# Patient Record
Sex: Female | Born: 1955 | Race: White | Hispanic: No | Marital: Single | Smoking: Current every day smoker
Health system: Southern US, Community
[De-identification: ages and names within clinical notes are randomized; demographics above are authoritative.]

## PROBLEM LIST (undated history)

## (undated) DIAGNOSIS — I10 Essential (primary) hypertension: Secondary | ICD-10-CM

## (undated) DIAGNOSIS — M109 Gout, unspecified: Secondary | ICD-10-CM

---

## 1997-10-16 ENCOUNTER — Other Ambulatory Visit: Admission: RE | Admit: 1997-10-16 | Discharge: 1997-10-16 | Payer: Self-pay | Admitting: Obstetrics and Gynecology

## 1999-01-12 ENCOUNTER — Other Ambulatory Visit: Admission: RE | Admit: 1999-01-12 | Discharge: 1999-01-12 | Payer: Self-pay | Admitting: Obstetrics and Gynecology

## 2000-05-02 ENCOUNTER — Other Ambulatory Visit: Admission: RE | Admit: 2000-05-02 | Discharge: 2000-05-02 | Payer: Self-pay | Admitting: Obstetrics and Gynecology

## 2001-11-13 ENCOUNTER — Other Ambulatory Visit: Admission: RE | Admit: 2001-11-13 | Discharge: 2001-11-13 | Payer: Self-pay | Admitting: Obstetrics and Gynecology

## 2003-05-14 ENCOUNTER — Other Ambulatory Visit: Admission: RE | Admit: 2003-05-14 | Discharge: 2003-05-14 | Payer: Self-pay | Admitting: Obstetrics and Gynecology

## 2005-01-03 ENCOUNTER — Other Ambulatory Visit: Admission: RE | Admit: 2005-01-03 | Discharge: 2005-01-03 | Payer: Self-pay | Admitting: Obstetrics and Gynecology

## 2008-07-26 ENCOUNTER — Emergency Department (HOSPITAL_COMMUNITY): Admission: EM | Admit: 2008-07-26 | Discharge: 2008-07-26 | Payer: Self-pay | Admitting: Emergency Medicine

## 2011-09-13 ENCOUNTER — Encounter (HOSPITAL_COMMUNITY): Payer: Self-pay | Admitting: Emergency Medicine

## 2011-09-13 ENCOUNTER — Emergency Department (HOSPITAL_COMMUNITY)
Admission: EM | Admit: 2011-09-13 | Discharge: 2011-09-14 | Disposition: A | Discharge status: Home / Self Care | Payer: Self-pay | Attending: Emergency Medicine | Admitting: Emergency Medicine

## 2011-09-13 DIAGNOSIS — I10 Essential (primary) hypertension: Secondary | ICD-10-CM | POA: Insufficient documentation

## 2011-09-13 DIAGNOSIS — W108XXA Fall (on) (from) other stairs and steps, initial encounter: Secondary | ICD-10-CM | POA: Insufficient documentation

## 2011-09-13 DIAGNOSIS — H5500 Unspecified nystagmus: Secondary | ICD-10-CM | POA: Insufficient documentation

## 2011-09-13 DIAGNOSIS — T148XXA Other injury of unspecified body region, initial encounter: Secondary | ICD-10-CM

## 2011-09-13 DIAGNOSIS — IMO0002 Reserved for concepts with insufficient information to code with codable children: Secondary | ICD-10-CM | POA: Insufficient documentation

## 2011-09-13 HISTORY — DX: Essential (primary) hypertension: I10

## 2011-09-13 NOTE — ED Notes (Signed)
Pt BIB EMS. Pt was walking home from work and fell and hit her head. Pt admits to having a few drinks before walking home. Pt denies LOC, nausea and vomiting. Pt has small lac on R side of head. Bleeding controlled.

## 2011-09-13 NOTE — ED Notes (Signed)
ZOX:[id number]<AV> Expected date:09/13/11<BR> Expected time:11:15 PM<BR> Means of arrival:Ambulance<BR> Comments:<BR> Fall, head injury, etoh

## 2011-09-14 ENCOUNTER — Emergency Department (HOSPITAL_COMMUNITY): Payer: Self-pay

## 2011-09-14 MED ORDER — HYDROCODONE-ACETAMINOPHEN 5-325 MG PO TABS: 1.0000 | ORAL_TABLET | Freq: Four times a day (QID) | ORAL | Status: AC | PRN

## 2011-09-14 MED ORDER — LIDOCAINE VISCOUS 2 % MT SOLN
20.0000 mL | Freq: Once | OROMUCOSAL | Status: AC
  Administered 2011-09-14: 20 mL via OROMUCOSAL
  Filled 2011-09-14: qty 20

## 2011-09-14 MED ORDER — TETANUS-DIPHTH-ACELL PERTUSSIS 5-2.5-18.5 LF-MCG/0.5 IM SUSP
0.5000 mL | Freq: Once | INTRAMUSCULAR | Status: AC
  Administered 2011-09-14: 0.5 mL via INTRAMUSCULAR
  Filled 2011-09-14: qty 0.5

## 2011-09-14 NOTE — ED Provider Notes (Signed)
Medical screening examination/treatment/procedure(s) were performed by non-physician practitioner and as supervising physician I was immediately available for consultation/collaboration.  Mischele Detter, MD 09/14/11 0805 

## 2011-09-14 NOTE — ED Notes (Signed)
Patient transported to CT 

## 2011-09-14 NOTE — ED Provider Notes (Signed)
History     CSN: [id number]  Arrival date & time 09/13/11  2326   First MD Initiated Contact with Patient 09/13/11 2338      Chief Complaint  Patient presents with  . Fall  . Head Laceration    (Consider location/radiation/quality/duration/timing/severity/associated sxs/prior treatment) HPI Comments: Patient with a history of hypertension presents emergency department with a chief complaint of fall and head laceration.  Patient states that she had had a couple of drinks before walking home when she fell going up the stairs.  Patient's head hit the concrete causing a laceration.  Bleeding has been controlled.  Patient denies any change in vision, nausea, disequilibrium, headaches, lightheadedness, syncope, loss of consciousness or other pain medications.  Patient denies being on blood thinners but reports taking aspirin daily. TDap status unknown.   Patient is a 56 y.o. female presenting with fall and scalp laceration. The history is provided by the patient.  Fall Pertinent negatives include no fever, no numbness, no abdominal pain and no headaches.  Head Laceration Pertinent negatives include no abdominal pain, chest pain, chills, congestion, fever, headaches, numbness or weakness.    Past Medical History  Diagnosis Date  . Hypertension     No past surgical history on file.  No family history on file.  History  Substance Use Topics  . Smoking status: Not on file  . Smokeless tobacco: Not on file  . Alcohol Use:     OB History    Grav Para Term Preterm Abortions TAB SAB Ect Mult Living                  Review of Systems  Constitutional: Negative for fever, chills and appetite change.  HENT: Negative for congestion.   Eyes: Negative for visual disturbance.  Respiratory: Negative for shortness of breath.   Cardiovascular: Negative for chest pain and leg swelling.  Gastrointestinal: Negative for abdominal pain.  Genitourinary: Negative for dysuria, urgency and  frequency.  Skin: Positive for wound.  Neurological: Negative for dizziness, syncope, weakness, light-headedness, numbness and headaches.  Psychiatric/Behavioral: Negative for confusion.    Allergies  Review of patient's allergies indicates no known allergies.  Home Medications   Current Outpatient Rx  Name Route Sig Dispense Refill  . BC FAST PAIN RELIEF ARTHRITIS PO Oral Take 1 packet by mouth daily as needed. For arthritis pain      BP 163/103  Pulse 91  Temp(Src) 97.7 F (36.5 C) (Oral)  Resp 18  SpO2 99%  Physical Exam  Nursing note and vitals reviewed. Constitutional: She is oriented to person, place, and time. She appears well-developed and well-nourished. No distress.  HENT:  Head: Normocephalic. Head is with abrasion. Head is without laceration.    Nose: Nose normal.       3 cm superficial abrasion located on the right scalp.  Bleeding controlled.  No raccoons eyes or battle sign.  No tenderness to palpation of  superior or inferior orbits.  Eyes: Conjunctivae and EOM are normal.  Neck: Normal range of motion.  Pulmonary/Chest: Effort normal.  Musculoskeletal: Normal range of motion.  Neurological: She is alert and oriented to person, place, and time. GCS eye subscore is 4. GCS verbal subscore is 5. GCS motor subscore is 6.       Cranial nerves III through XII intact. Bilateral nystagmus present. Gait and coordination normal.  Strength 5/5 bilaterally in upper and lower extremities.  Sensation intact.  Skin: Skin is warm and dry. No rash noted. She  is not diaphoretic.  Psychiatric: She has a normal mood and affect. Her behavior is normal.    ED Course  Procedures (including critical care time)  Labs Reviewed - No data to display Ct Head Wo Contrast  09/14/2011  *RADIOLOGY REPORT*  Clinical Data: Laceration status post fall.  CT HEAD WITHOUT CONTRAST  Technique:  Contiguous axial images were obtained from the base of the skull through the vertex without  contrast.  Comparison: None.  Findings: Mild prominence of the sulci, cisterns, and ventricles, in keeping with volume loss. There are mild subcortical and periventricular white matter hypodensities, a nonspecific finding most often seen with chronic microangiopathic changes.  There is no evidence for acute hemorrhage, overt hydrocephalus, mass lesion, or abnormal extra-axial fluid collection.  No definite CT evidence for acute cortical based (large artery) infarction. Bilateral basal ganglia hypodensities may reflect prominent perivascular spaces or remote lacunar infarctions. The visualized paranasal sinuses and mastoid air cells are predominately clear. No displaced calvarial fracture.  There is a right posterior scalp hematoma and laceration without underlying calvarial fracture.  IMPRESSION: No acute intracranial abnormality. Bilateral basal ganglia hypodensities may reflect prominent perivascular spaces or remote lacunar infarctions.  Right posterior scalp laceration/hematoma.  No underlying calvarial fracture identified  Original Report Authenticated By: Waneta Martins, M.D.     No diagnosis found.    MDM  Scalp abrasion/laceration Hypertension  Pt pain free in ED, bleeding controlled and Tdap given  Non concerning for SAH, ICH, Meningitis, or fracture. Pt is afebrile with no focal neuro deficits, nuchal rigidity, or change in vision. Pt is to follow up with PCP to discuss prophylactic medication. Pt verbalizes understanding and is agreeable with plan to dc.          Jaci Carrel, [place] 09/14/11 [phone]

## 2011-09-14 NOTE — ED Notes (Signed)
Pt states she had "6 beers and a couple of shots" and had something heavy in her hands and lost her balance and hit her head on the concrete. Pt denies LOC. Pt a/o x3. Pt denies nausea and vomiting.

## 2011-09-14 NOTE — Discharge Instructions (Signed)
Abrasions Abrasions are skin scrapes. Their treatment depends on how large and deep the abrasion is. Abrasions do not extend through all layers of the skin. A cut or lesion through all skin layers is called a laceration. HOME CARE INSTRUCTIONS   If you were given a dressing, change it at least once a day or as instructed by your caregiver. If the bandage sticks, soak it off with a solution of water or hydrogen peroxide.   Twice a day, wash the area with soap and water to remove all the cream/ointment. You may do this in a sink, under a tub faucet, or in a shower. Rinse off the soap and pat dry with a clean towel. Look for signs of infection (see below).   Reapply cream/ointment according to your caregiver's instruction. This will help prevent infection and keep the bandage from sticking. Telfa or gauze over the wound and under the dressing or wrap will also help keep the bandage from sticking.   If the bandage becomes wet, dirty, or develops a foul smell, change it as soon as possible.   Only take over-the-counter or prescription medicines for pain, discomfort, or fever as directed by your caregiver.  SEEK IMMEDIATE MEDICAL CARE IF:   Increasing pain in the wound.   Signs of infection develop: redness, swelling, surrounding area is tender to touch, or pus coming from the wound.   You have a fever.   Any foul smell coming from the wound or dressing.  Most skin wounds heal within ten days. Facial wounds heal faster. However, an infection may occur despite proper treatment. You should have the wound checked for signs of infection within 24 to 48 hours or sooner if problems arise. If you were not given a wound-check appointment, look closely at the wound yourself on the second day for early signs of infection listed above. MAKE SURE YOU:   Understand these instructions.   Will watch your condition.   Will get help right away if you are not doing well or get worse.  Document Released:  01/18/2005 Document Revised: 03/30/2011 Document Reviewed: 03/14/2011 Exit[employer] Patient Information 2012 [employer]. 

## 2011-09-14 NOTE — ED Notes (Signed)
Remigio Eisenmenger, pt's ex-husband,  phone number [phone].

## 2012-10-04 ENCOUNTER — Encounter (HOSPITAL_COMMUNITY): Payer: Self-pay | Admitting: Emergency Medicine

## 2012-10-04 ENCOUNTER — Emergency Department (HOSPITAL_COMMUNITY)
Admission: EM | Admit: 2012-10-04 | Discharge: 2012-10-04 | Disposition: A | Discharge status: Home / Self Care | Payer: Self-pay | Attending: Emergency Medicine | Admitting: Emergency Medicine

## 2012-10-04 ENCOUNTER — Ambulatory Visit: Payer: Self-pay

## 2012-10-04 ENCOUNTER — Emergency Department (HOSPITAL_COMMUNITY): Payer: Self-pay

## 2012-10-04 DIAGNOSIS — S46909A Unspecified injury of unspecified muscle, fascia and tendon at shoulder and upper arm level, unspecified arm, initial encounter: Secondary | ICD-10-CM | POA: Insufficient documentation

## 2012-10-04 DIAGNOSIS — Y9389 Activity, other specified: Secondary | ICD-10-CM | POA: Insufficient documentation

## 2012-10-04 DIAGNOSIS — S52209A Unspecified fracture of shaft of unspecified ulna, initial encounter for closed fracture: Secondary | ICD-10-CM | POA: Insufficient documentation

## 2012-10-04 DIAGNOSIS — I10 Essential (primary) hypertension: Secondary | ICD-10-CM | POA: Insufficient documentation

## 2012-10-04 DIAGNOSIS — F17200 Nicotine dependence, unspecified, uncomplicated: Secondary | ICD-10-CM | POA: Insufficient documentation

## 2012-10-04 DIAGNOSIS — S5290XA Unspecified fracture of unspecified forearm, initial encounter for closed fracture: Secondary | ICD-10-CM | POA: Insufficient documentation

## 2012-10-04 DIAGNOSIS — Y9289 Other specified places as the place of occurrence of the external cause: Secondary | ICD-10-CM | POA: Insufficient documentation

## 2012-10-04 DIAGNOSIS — Z791 Long term (current) use of non-steroidal anti-inflammatories (NSAID): Secondary | ICD-10-CM | POA: Insufficient documentation

## 2012-10-04 DIAGNOSIS — S4980XA Other specified injuries of shoulder and upper arm, unspecified arm, initial encounter: Secondary | ICD-10-CM | POA: Insufficient documentation

## 2012-10-04 DIAGNOSIS — S52611A Displaced fracture of right ulna styloid process, initial encounter for closed fracture: Secondary | ICD-10-CM

## 2012-10-04 DIAGNOSIS — R209 Unspecified disturbances of skin sensation: Secondary | ICD-10-CM | POA: Insufficient documentation

## 2012-10-04 DIAGNOSIS — S5291XA Unspecified fracture of right forearm, initial encounter for closed fracture: Secondary | ICD-10-CM

## 2012-10-04 DIAGNOSIS — R296 Repeated falls: Secondary | ICD-10-CM | POA: Insufficient documentation

## 2012-10-04 NOTE — ED Provider Notes (Signed)
History    This chart was scribed for Paula Chandler, non-physician practitioner working with Vida Roller, MD by Leone Payor, ED Scribe. This patient was seen in room WTR7/WTR7 and the patient's care was started at 1536.   CSN: [id number]  Arrival date & time 10/04/12  1536   First MD Initiated Contact with Patient 10/04/12 1600      Chief Complaint  Patient presents with  . Fall  . Arm Injury  . Hand Injury    hand swollen due to fall     The history is provided by the patient. No language interpreter was used.    HPI Comments: Paula Chandler is a 57 y.o. female who presents to the Emergency Department complaining of a R hand and forearm injury that occurred 3 days ago (10/02/12) after a fall. She hit her forearm on the middle of a step. States the pain is bearable and is not excruciating. She did not hit her head or have LOC. The pain was not very bad at first and she only noticed the swelling upon waking yesterday. She has noticed the redness and warmth today. She has taken aleve, used ice, and has been elevating the injured arm. She denies numbness, elbow pain, chest pain, SOB.     Past Medical History  Diagnosis Date  . Hypertension     History reviewed. No pertinent past surgical history.  Family History  Problem Relation Age of Onset  . Cancer Mother     History  Substance Use Topics  . Smoking status: Current Every Day Smoker    Types: Cigarettes  . Smokeless tobacco: Not on file  . Alcohol Use: Yes    OB History   Grav Para Term Preterm Abortions TAB SAB Ect Mult Living                  Review of Systems  Respiratory: Negative for shortness of breath.   Cardiovascular: Negative for chest pain.  Musculoskeletal: Positive for joint swelling and arthralgias.  Neurological: Negative for numbness.       Tingling   All other systems reviewed and are negative.    Allergies  Review of patient's allergies indicates no known allergies.  Home  Medications   Current Outpatient Rx  Name  Route  Sig  Dispense  Refill  . Aspirin-Caffeine (BC FAST PAIN RELIEF ARTHRITIS PO)   Oral   Take 1 packet by mouth daily as needed. For arthritis pain         . naproxen sodium (ANAPROX) 220 MG tablet   Oral   Take 220 mg by mouth 2 (two) times daily with a meal.           BP 141/86  Pulse 95  Temp(Src) 98.6 F (37 C) (Oral)  Resp 18  Wt 126 lb (57.153 kg)  SpO2 99%  Physical Exam  Nursing note and vitals reviewed. Constitutional: She is oriented to person, place, and time. She appears well-developed and well-nourished. No distress.  HENT:  Head: Normocephalic and atraumatic.  Eyes: Conjunctivae and EOM are normal. Pupils are equal, round, and reactive to light.  Neck: Normal range of motion. Neck supple.  Cardiovascular: Normal rate, regular rhythm and normal heart sounds.  Exam reveals no friction rub.   No murmur heard. Pulses:      Radial pulses are 2+ on the right side, and 2+ on the left side.       Dorsalis pedis pulses are 2+ on the right  side, and 2+ on the left side.  Cap refill < 3 seconds to right hand  Pulmonary/Chest: Effort normal and breath sounds normal. No respiratory distress. She has no wheezes. She has no rales.  Musculoskeletal: Normal range of motion.  Decreased flexion, extension, supination, and pronation to the right wrist and hand secondary to pain.   Neurological: She is alert and oriented to person, place, and time.  Sensation intact to R upper extremity with differentiation to sharp and dull touch.   Skin: Skin is warm and dry.  Swelling and erythema noted to the right wrist and forearm. Mild discomfort upon palpation. Ecchymosis noted to the right hand and forearm.   Psychiatric: She has a normal mood and affect. Her behavior is normal.    ED Course  Procedures (including critical care time)  DIAGNOSTIC STUDIES: Oxygen Saturation is 99% on room air, normal by my interpretation.     COORDINATION OF CARE: 4:57 PM Discussed treatment plan with pt at bedside and pt agreed to plan.   Labs Reviewed - No data to display Dg Elbow Complete Right  10/04/2012   *RADIOLOGY REPORT*  Clinical Data: 57 year old female status post fall.  Swelling.  RIGHT ELBOW - COMPLETE 3+ VIEW  Comparison: None.  Findings: Advanced degenerative spurring about the elbow. Equivocal fat pad sign such that joint effusion cannot be excluded. No radial head fracture identified.  Prominent medial and anterior radial head degenerative spurring.  Distal humerus and proximal ulna appear intact.  IMPRESSION: Equivocal for elbow joint effusion.  No definite acute fracture or dislocation.  Advanced degenerative changes.   Original Report Authenticated By: Erskine Speed, M.D.   Dg Wrist Complete Right  10/04/2012   *RADIOLOGY REPORT*  Clinical Data: 57 year old female status post fall with swelling.  RIGHT WRIST - COMPLETE 3+ VIEW  Comparison: None.  Findings: Comminuted mildly impacted and dorsally angulated distal right radius fracture.  Radiocarpal and DRU joint involvement. Minimally-displaced ulnar styloid fracture.  Carpal bone alignment within normal limits.  Scaphoid intact.  Visible metacarpals intact.  IMPRESSION: 1.  Comminuted and mildly impacted and dorsally angulated distal right radius fracture.  Radiocarpal and DRU joint involvement. 2.  Ulnar styloid fracture.   Original Report Authenticated By: Erskine Speed, M.D.   Dg Hand Complete Right  10/04/2012   *RADIOLOGY REPORT*  Clinical Data: 57 year old female status post fall.  Swelling. Bruising.  RIGHT HAND - COMPLETE 3+ VIEW  Comparison: Right wrist series reported separately.  Findings: See right wrist series regarding distal radius and ulna fractures.  Carpal bone alignment within normal limits.  Metacarpals intact.  Tiny ossific fragment at the radial aspect of the second MCP joint. Favor chronic and degenerative.  Evidence of, evidence of subchondral cysts  at the second metacarpal head, degenerative.  There is also small ossific fragment along the radial aspect of the second DIP.  Similar small fragment at the fifth DIP.  Otherwise, the phalanges appear intact.  IMPRESSION: 1.  See right wrist series regarding distal radius and ulna fractures. 2. Tiny probably chronic and/or degenerative ossific fragments along the second and fifth DIPs and second MCP.  If there is point tenderness at any of these sites, these could reflect small of avulsion type fractures. 3.  No other acute fracture or dislocation identified about the right hand.   Original Report Authenticated By: Odessa Fleming III, M.D.     1. Radial fracture, right, closed, initial encounter   2. Fracture of ulnar styloid, right, closed, initial  encounter       MDM  I personally performed the services described in this documentation, which was scribed in my presence. The recorded information has been reviewed and is accurate.  3 day old comminuted mild impacted and dorsally angulated distal radial fracture noted. Minimally displaced ulnar styloid fracture. Patient placed in sugar tong splint. Negative neurovascular damage noted. Patient stable, afebrile. Discharged patient. Discussed care with splint and arm. Referred to hand surgery to follow-up. Discussed with patient to continue to monitor symptoms and if symptoms are to worsen or change to report back to the ED - strict instructions given. Patient agreed to plan of care, understood, all questions answered.    Paula Mutton, PA-C 10/05/12 [phone]

## 2012-10-04 NOTE — ED Notes (Signed)
R/forearm swollen, deformed and discolored. R/hand swollen with normal ROM of fingers. Pt lost balance and fell onto r/arm 3 days ago.

## 2012-10-04 NOTE — ED Notes (Signed)
Ortho tech at bedside for application of splint 

## 2012-10-05 NOTE — ED Provider Notes (Signed)
Medical screening examination/treatment/procedure(s) were performed by non-physician practitioner and as supervising physician I was immediately available for consultation/collaboration.    Vida Roller, MD 10/05/12 [phone]

## 2013-09-19 ENCOUNTER — Ambulatory Visit: Payer: 59

## 2013-09-19 ENCOUNTER — Ambulatory Visit (INDEPENDENT_AMBULATORY_CARE_PROVIDER_SITE_OTHER): Payer: 59 | Admitting: Family Medicine

## 2013-09-19 VITALS — BP 116/68 | HR 79 | Temp 98.2°F | Resp 18 | Ht 63.0 in | Wt 118.0 lb

## 2013-09-19 DIAGNOSIS — M76899 Other specified enthesopathies of unspecified lower limb, excluding foot: Secondary | ICD-10-CM

## 2013-09-19 DIAGNOSIS — M7062 Trochanteric bursitis, left hip: Secondary | ICD-10-CM

## 2013-09-19 DIAGNOSIS — G8929 Other chronic pain: Secondary | ICD-10-CM

## 2013-09-19 DIAGNOSIS — M25559 Pain in unspecified hip: Principal | ICD-10-CM

## 2013-09-19 DIAGNOSIS — M25569 Pain in unspecified knee: Secondary | ICD-10-CM

## 2013-09-19 MED ORDER — PREDNISONE 20 MG PO TABS: ORAL_TABLET | ORAL | Status: DC

## 2013-09-19 NOTE — Progress Notes (Signed)
°  This chart was scribed for Paula Hai, MD by Joaquin Music, ED Scribe. This patient was seen in room Room/bed 11 and the patient's care was started at 1:15 PM. Subjective:    Patient ID: Paula Chandler, female    DOB: Mar 18, 1956, 58 y.o.   MRN: [id number] Chief Complaint  Patient presents with   Hip Pain    pain starts on left side runs down toward foot x48mths worse these 2 weeks    Leg Pain   Foot Pain   HPI Paula Chandler is a 58 y.o. female with hx of chronic hip pain who presents to the [facility] complaining of ongoing L sided hip pain that began 6 months ago but has worsened within the past 2 weeks. Pt states she recently got a new job and states she is always on her feet and frequently walking; states towards the conclusion of her shift, her pain is unbearable. She states the hip pain radiates down her leg. Denies any recent falls or injuries or back pain.  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  There are no active problems to display for this patient.  Current outpatient prescriptions:Aspirin-Caffeine (BC FAST PAIN RELIEF ARTHRITIS PO), Take 1 packet by mouth daily as needed. For arthritis pain, Disp: , Rfl: ;  naproxen sodium (ANAPROX) 220 MG tablet, Take 220 mg by mouth 2 (two) times daily with a meal., Disp: , Rfl:   Review of Systems  Musculoskeletal: Positive for gait problem. Negative for back pain.  Skin: Negative for wound.   Objective:   Physical Exam  Nursing note and vitals reviewed. Constitutional: She is oriented to person, place, and time. She appears well-developed and well-nourished. No distress.  HENT:  Head: Normocephalic and atraumatic.  Right Ear: External ear normal.  Left Ear: External ear normal.  Eyes: Conjunctivae are normal. Pupils are equal, round, and reactive to light.  Neck: Normal range of motion. Neck supple.  Cardiovascular: Normal rate.   No murmur heard. Pulmonary/Chest: Effort normal.  Abdominal: Bowel  sounds are normal.  Musculoskeletal: Normal range of motion.  Negative straight leg raise. Very tender L trochanter.  Neurological: She is alert and oriented to person, place, and time. She has normal reflexes.  Normal reflexes.    Skin: Skin is warm and dry. She is not diaphoretic.  Psychiatric: She has a normal mood and affect. Her behavior is normal.   Triage Vitals:BP 116/68   Pulse 79   Temp(Src) 98.2 F (36.8 C) (Oral)   Resp 18   Ht 5\' 3"  (1.6 m)   Wt 118 lb (53.524 kg)   BMI 20.91 kg/m2   SpO2 98%  [facility] preliminary x-ray report read by Dr. Comer Locket: Negative hip.  Assessment & Plan:   Hip pain, chronic - Plan: DG Hip Complete Left  Pain in joint, lower leg - Plan: DG Hip Complete Left  Trochanteric bursitis of left hip - Plan: predniSONE (DELTASONE) 20 MG tablet  Signed, Elvina Sidle, MD

## 2013-09-19 NOTE — Patient Instructions (Signed)
Hip Bursitis  Bursitis is a swelling and soreness (inflammation) of a fluid-filled sac (bursa). This sac overlies and protects the joints.   CAUSES   · Injury.  · Overuse of the muscles surrounding the joint.  · Arthritis.  · Gout.  · Infection.  · Cold weather.  · Inadequate warm-up and conditioning prior to activities.  The cause may not be known.   SYMPTOMS   · Mild to severe irritation.  · Tenderness and swelling over the outside of the hip.  · Pain with motion of the hip.  · If the bursa becomes infected, a fever may be present. Redness, tenderness, and warmth will develop over the hip.  Symptoms usually lessen in 3 to 4 weeks with treatment, but can come back.  TREATMENT  If conservative treatment does not work, your caregiver may advise draining the bursa and injecting cortisone into the area. This may speed up the healing process. This may also be used as an initial treatment of choice.  HOME CARE INSTRUCTIONS   · Apply ice to the affected area for 15-20 minutes every 3 to 4 hours while awake for the first 2 days. Put the ice in a plastic bag and place a towel between the bag of ice and your skin.  · Rest the painful joint as much as possible, but continue to put the joint through a normal range of motion at least 4 times per day. When the pain lessens, begin normal, slow movements and usual activities to help prevent stiffness of the hip.  · Only take over-the-counter or prescription medicines for pain, discomfort, or fever as directed by your caregiver.  · Use crutches to limit weight bearing on the hip joint, if advised.  · Elevate your painful hip to reduce swelling. Use pillows for propping and cushioning your legs and hips.  · Gentle massage may provide comfort and decrease swelling.  SEEK IMMEDIATE MEDICAL CARE IF:   · Your pain increases even during treatment, or you are not improving.  · You have a fever.  · You have heat and inflammation over the involved bursa.  · You have any other questions or  concerns.  MAKE SURE YOU:   · Understand these instructions.  · Will watch your condition.  · Will get help right away if you are not doing well or get worse.  Document Released: 09/30/2001 Document Revised: 07/03/2011 Document Reviewed: 04/29/2008  Exit[employer]® Patient Information ©2014 [employer].

## 2013-12-19 ENCOUNTER — Ambulatory Visit (INDEPENDENT_AMBULATORY_CARE_PROVIDER_SITE_OTHER): Payer: 59 | Admitting: Family Medicine

## 2013-12-19 VITALS — BP 148/76 | HR 76 | Temp 97.8°F | Resp 16 | Ht 62.0 in | Wt 114.0 lb

## 2013-12-19 DIAGNOSIS — M25561 Pain in right knee: Secondary | ICD-10-CM

## 2013-12-19 DIAGNOSIS — M25552 Pain in left hip: Secondary | ICD-10-CM

## 2013-12-19 DIAGNOSIS — M25569 Pain in unspecified knee: Secondary | ICD-10-CM

## 2013-12-19 DIAGNOSIS — M25559 Pain in unspecified hip: Secondary | ICD-10-CM

## 2013-12-19 MED ORDER — MELOXICAM 15 MG PO TABS: 15.0000 mg | ORAL_TABLET | Freq: Every day | ORAL | Status: DC

## 2013-12-19 MED ORDER — CYCLOBENZAPRINE HCL 10 MG PO TABS: 10.0000 mg | ORAL_TABLET | Freq: Every evening | ORAL | Status: DC | PRN

## 2013-12-19 NOTE — Progress Notes (Signed)
   Subjective:    Patient ID: Paula Chandler, female    DOB: [birth year], 58 y.o.   MRN: [id number]  HPI Patient presents today with continued left hip pain. She was seen by Dr. Milus Glazier 5/29 and Xray was negative. She was given prednisone and had some temporary improvement. She reports that the pain has gotten worse, causing her to alter her gait and now her right knee hurts. Her left hip gave out on her last week, causing her to fall.    Review of Systems Tingling all the time, foot falls asleep, no back pain    Objective:   Physical Exam  Vitals reviewed. Constitutional: She is oriented to person, place, and time. She appears well-developed and well-nourished.  HENT:  Head: Normocephalic and atraumatic.  Eyes: Conjunctivae are normal. Pupils are equal, round, and reactive to light.  Neck: Normal range of motion. Neck supple.  Cardiovascular: Normal rate, regular rhythm and normal heart sounds.   Pulmonary/Chest: Effort normal and breath sounds normal.  Musculoskeletal:       Left hip: She exhibits decreased range of motion, decreased strength and tenderness.  Decreased ROM with internal/external rotation, adduction/abduction. Pain over gluteus maximus.   Right knee with good ROM, some pain with extension, no instability, tender over lateral/medial aspects of patella.   Neurological: She is alert and oriented to person, place, and time.  Skin: Skin is warm and dry.  Psychiatric: She has a normal mood and affect. Her behavior is normal. Judgment and thought content normal.      Assessment & Plan:  1. Left hip pain - cyclobenzaprine (FLEXERIL) 10 MG tablet; Take 1 tablet (10 mg total) by mouth at bedtime as needed for muscle spasms.  Dispense: 30 tablet; Refill: 0 - meloxicam (MOBIC) 15 MG tablet; Take 1 tablet (15 mg total) by mouth daily.  Dispense: 30 tablet; Refill: 0 - Ambulatory referral to Orthopedic Surgery  2. Right knee pain - I suspect this is related to left hip pain  and altered gait -Ambulatory referral to Orthopedic Surgery  Emi Belfast, FNP-BC  [facility]  12/21/2013 10:28 PM

## 2013-12-19 NOTE — Patient Instructions (Signed)
Take medicine as prescribed We will call you with an appointment with the orthopedic doctor Do gentle ROM twice a day

## 2014-01-13 ENCOUNTER — Ambulatory Visit (INDEPENDENT_AMBULATORY_CARE_PROVIDER_SITE_OTHER): Payer: 59 | Admitting: Family Medicine

## 2014-01-13 ENCOUNTER — Ambulatory Visit (INDEPENDENT_AMBULATORY_CARE_PROVIDER_SITE_OTHER): Payer: 59

## 2014-01-13 VITALS — BP 148/94 | HR 78 | Temp 98.0°F | Resp 17 | Ht 62.0 in | Wt 118.0 lb

## 2014-01-13 DIAGNOSIS — R05 Cough: Secondary | ICD-10-CM

## 2014-01-13 DIAGNOSIS — R059 Cough, unspecified: Secondary | ICD-10-CM

## 2014-01-13 DIAGNOSIS — F17200 Nicotine dependence, unspecified, uncomplicated: Secondary | ICD-10-CM

## 2014-01-13 DIAGNOSIS — J189 Pneumonia, unspecified organism: Secondary | ICD-10-CM

## 2014-01-13 LAB — POCT CBC
Granulocyte percent: 79.7 %G (ref 37–80)
HCT, POC: 38.4 % (ref 37.7–47.9)
Hemoglobin: 12.6 g/dL (ref 12.2–16.2)
Lymph, poc: 1.5 (ref 0.6–3.4)
MCH: 33.8 pg — AB (ref 27–31.2)
MCHC: 32.9 g/dL (ref 31.8–35.4)
MCV: 102.9 fL — AB (ref 80–97)
MID (CBC): 0.6 (ref 0–0.9)
MPV: 8.2 fL (ref 0–99.8)
PLATELET COUNT, POC: 242 10*3/uL (ref 142–424)
POC Granulocyte: 8 — AB (ref 2–6.9)
POC LYMPH %: 14.6 % (ref 10–50)
POC MID %: 5.7 %M (ref 0–12)
RBC: 3.73 M/uL — AB (ref 4.04–5.48)
RDW, POC: 14 %
WBC: 10.1 10*3/uL (ref 4.6–10.2)

## 2014-01-13 MED ORDER — HYDROCOD POLST-CHLORPHEN POLST 10-8 MG/5ML PO LQCR: 5.0000 mL | Freq: Two times a day (BID) | ORAL | Status: DC | PRN

## 2014-01-13 MED ORDER — ALBUTEROL SULFATE (2.5 MG/3ML) 0.083% IN NEBU
2.5000 mg | INHALATION_SOLUTION | Freq: Once | RESPIRATORY_TRACT | Status: AC
  Administered 2014-01-13: 2.5 mg via RESPIRATORY_TRACT

## 2014-01-13 MED ORDER — AZITHROMYCIN 500 MG PO TABS: 500.0000 mg | ORAL_TABLET | Freq: Every day | ORAL | Status: DC

## 2014-01-13 MED ORDER — ALBUTEROL SULFATE 108 (90 BASE) MCG/ACT IN AEPB: 2.0000 | INHALATION_SPRAY | RESPIRATORY_TRACT | Status: DC | PRN

## 2014-01-13 MED ORDER — PREDNISONE 20 MG PO TABS: 40.0000 mg | ORAL_TABLET | Freq: Every day | ORAL | Status: DC

## 2014-01-13 NOTE — Patient Instructions (Signed)
Pneumonia Pneumonia is an infection of the lungs.  CAUSES Pneumonia may be caused by bacteria or a virus. Usually, these infections are caused by breathing infectious particles into the lungs (respiratory tract). SIGNS AND SYMPTOMS   Cough.  Fever.  Chest pain.  Increased rate of breathing.  Wheezing.  Mucus production. DIAGNOSIS  If you have the common symptoms of pneumonia, your health care provider will typically confirm the diagnosis with a chest X-ray. The X-ray will show an abnormality in the lung (pulmonary infiltrate) if you have pneumonia. Other tests of your blood, urine, or sputum may be done to find the specific cause of your pneumonia. Your health care provider may also do tests (blood gases or pulse oximetry) to see how well your lungs are working. TREATMENT  Some forms of pneumonia may be spread to other people when you cough or sneeze. You may be asked to wear a mask before and during your exam. Pneumonia that is caused by bacteria is treated with antibiotic medicine. Pneumonia that is caused by the influenza virus may be treated with an antiviral medicine. Most other viral infections must run their course. These infections will not respond to antibiotics.  HOME CARE INSTRUCTIONS   Cough suppressants may be used if you are losing too much rest. However, coughing protects you by clearing your lungs. You should avoid using cough suppressants if you can.  Your health care provider may have prescribed medicine if he or she thinks your pneumonia is caused by bacteria or influenza. Finish your medicine even if you start to feel better.  Your health care provider may also prescribe an expectorant. This loosens the mucus to be coughed up.  Take medicines only as directed by your health care provider.  Do not smoke. Smoking is a common cause of bronchitis and can contribute to pneumonia. If you are a smoker and continue to smoke, your cough may last several weeks after your  pneumonia has cleared.  A cold steam vaporizer or humidifier in your room or home may help loosen mucus.  Coughing is often worse at night. Sleeping in a semi-upright position in a recliner or using a couple pillows under your head will help with this.  Get rest as you feel it is needed. Your body will usually let you know when you need to rest. PREVENTION A pneumococcal shot (vaccine) is available to prevent a common bacterial cause of pneumonia. This is usually suggested for:  People over 65 years old.  Patients on chemotherapy.  People with chronic lung problems, such as bronchitis or emphysema.  People with immune system problems. If you are over 65 or have a high risk condition, you may receive the pneumococcal vaccine if you have not received it before. In some countries, a routine influenza vaccine is also recommended. This vaccine can help prevent some cases of pneumonia.You may be offered the influenza vaccine as part of your care. If you smoke, it is time to quit. You may receive instructions on how to stop smoking. Your health care provider can provide medicines and counseling to help you quit. SEEK MEDICAL CARE IF: You have a fever. SEEK IMMEDIATE MEDICAL CARE IF:   Your illness becomes worse. This is especially true if you are elderly or weakened from any other disease.  You cannot control your cough with suppressants and are losing sleep.  You begin coughing up blood.  You develop pain which is getting worse or is uncontrolled with medicines.  Any of the symptoms   which initially brought you in for treatment are getting worse rather than better.  You develop shortness of breath or chest pain. MAKE SURE YOU:   Understand these instructions.  Will watch your condition.  Will get help right away if you are not doing well or get worse. Document Released: 04/10/2005 Document Revised: 08/25/2013 Document Reviewed: 06/30/2010 [facility] Patient Information 2015  [place], [place]. This information is not intended to replace advice given to you by your health care provider. Make sure you discuss any questions you have with your health care provider.  Bronchospasm A bronchospasm is a spasm or tightening of the airways going into the lungs. During a bronchospasm breathing becomes more difficult because the airways get smaller. When this happens there can be coughing, a whistling sound when breathing (wheezing), and difficulty breathing. Bronchospasm is often associated with asthma, but not all patients who experience a bronchospasm have asthma. CAUSES  A bronchospasm is caused by inflammation or irritation of the airways. The inflammation or irritation may be triggered by:   Allergies (such as to animals, pollen, food, or mold). Allergens that cause bronchospasm may cause wheezing immediately after exposure or many hours later.   Infection. Viral infections are believed to be the most common cause of bronchospasm.   Exercise.   Irritants (such as pollution, cigarette smoke, strong odors, aerosol sprays, and paint fumes).   Weather changes. Winds increase molds and pollens in the air. Rain refreshes the air by washing irritants out. Cold air may cause inflammation.   Stress and emotional upset.  SIGNS AND SYMPTOMS   Wheezing.   Excessive nighttime coughing.   Frequent or severe coughing with a simple cold.   Chest tightness.   Shortness of breath.  DIAGNOSIS  Bronchospasm is usually diagnosed through a history and physical exam. Tests, such as chest X-rays, are sometimes done to look for other conditions. TREATMENT   Inhaled medicines can be given to open up your airways and help you breathe. The medicines can be given using either an inhaler or a nebulizer machine.  Corticosteroid medicines may be given for severe bronchospasm, usually when it is associated with asthma. HOME CARE INSTRUCTIONS   Always have a plan prepared for  seeking medical care. Know when to call your health care provider and local emergency services (911 in the [place] Know where you can access local emergency care.  Only take medicines as directed by your health care provider.  If you were prescribed an inhaler or nebulizer machine, ask your health care provider to explain how to use it correctly. Always use a spacer with your inhaler if you were given one.  It is necessary to remain calm during an attack. Try to relax and breathe more slowly.  Control your home environment in the following ways:   Change your heating and air conditioning filter at least once a month.   Limit your use of fireplaces and wood stoves.  Do not smoke and do not allow smoking in your home.   Avoid exposure to perfumes and fragrances.   Get rid of pests (such as roaches and mice) and their droppings.   Throw away plants if you see mold on them.   Keep your house clean and dust free.   Replace carpet with wood, tile, or vinyl flooring. Carpet can trap dander and dust.   Use allergy-proof pillows, mattress covers, and box spring covers.   Wash bed sheets and blankets every week in hot water and dry them in a  dryer.   Use blankets that are made of polyester or cotton.   Wash hands frequently. SEEK MEDICAL CARE IF:   You have muscle aches.   You have chest pain.   The sputum changes from clear or white to yellow, green, gray, or bloody.   The sputum you cough up gets thicker.   There are problems that may be related to the medicine you are given, such as a rash, itching, swelling, or trouble breathing.  SEEK IMMEDIATE MEDICAL CARE IF:   You have worsening wheezing and coughing even after taking your prescribed medicines.   You have increased difficulty breathing.   You develop severe chest pain. MAKE SURE YOU:   Understand these instructions.  Will watch your condition.  Will get help right away if you are not doing well  or get worse. Document Released: 04/13/2003 Document Revised: 04/15/2013 Document Reviewed: 09/30/2012 [facility] Patient Information 2015 [place], [place]. This information is not intended to replace advice given to you by your health care provider. Make sure you discuss any questions you have with your health care provider. Chronic Obstructive Pulmonary Disease Chronic obstructive pulmonary disease (COPD) is a common lung condition in which airflow from the lungs is limited. COPD is a general term that can be used to describe many different lung problems that limit airflow, including both chronic bronchitis and emphysema. If you have COPD, your lung function will probably never return to normal, but there are measures you can take to improve lung function and make yourself feel better.  CAUSES   Smoking (common).   Exposure to secondhand smoke.   Genetic problems.  Chronic inflammatory lung diseases or recurrent infections. SYMPTOMS   Shortness of breath, especially with physical activity.   Deep, persistent (chronic) cough with a large amount of thick mucus.   Wheezing.   Rapid breaths (tachypnea).   Gray or bluish discoloration (cyanosis) of the skin, especially in fingers, toes, or lips.   Fatigue.   Weight loss.   Frequent infections or episodes when breathing symptoms become much worse (exacerbations).   Chest tightness. DIAGNOSIS  Your health care provider will take a medical history and perform a physical examination to make the initial diagnosis. Additional tests for COPD may include:   Lung (pulmonary) function tests.  Chest X-ray.  CT scan.  Blood tests. TREATMENT  Treatment available to help you feel better when you have COPD includes:   Inhaler and nebulizer medicines. These help manage the symptoms of COPD and make your breathing more comfortable.  Supplemental oxygen. Supplemental oxygen is only helpful if you have a low oxygen level in your  blood.   Exercise and physical activity. These are beneficial for nearly all people with COPD. Some people may also benefit from a pulmonary rehabilitation program. HOME CARE INSTRUCTIONS   Take all medicines (inhaled or pills) as directed by your health care provider.  Avoid over-the-counter medicines or cough syrups that dry up your airway (such as antihistamines) and slow down the elimination of secretions unless instructed otherwise by your health care provider.   If you are a smoker, the most important thing that you can do is stop smoking. Continuing to smoke will cause further lung damage and breathing trouble. Ask your health care provider for help with quitting smoking. He or she can direct you to community resources or hospitals that provide support.  Avoid exposure to irritants such as smoke, chemicals, and fumes that aggravate your breathing.  Use oxygen therapy and pulmonary rehabilitation  if directed by your health care provider. If you require home oxygen therapy, ask your health care provider whether you should purchase a pulse oximeter to measure your oxygen level at home.   Avoid contact with individuals who have a contagious illness.  Avoid extreme temperature and humidity changes.  Eat healthy foods. Eating smaller, more frequent meals and resting before meals may help you maintain your strength.  Stay active, but balance activity with periods of rest. Exercise and physical activity will help you maintain your ability to do things you want to do.  Preventing infection and hospitalization is very important when you have COPD. Make sure to receive all the vaccines your health care provider recommends, especially the pneumococcal and influenza vaccines. Ask your health care provider whether you need a pneumonia vaccine.  Learn and use relaxation techniques to manage stress.  Learn and use controlled breathing techniques as directed by your health care provider.  Controlled breathing techniques include:   Pursed lip breathing. Start by breathing in (inhaling) through your nose for 1 second. Then, purse your lips as if you were going to whistle and breathe out (exhale) through the pursed lips for 2 seconds.   Diaphragmatic breathing. Start by putting one hand on your abdomen just above your waist. Inhale slowly through your nose. The hand on your abdomen should move out. Then purse your lips and exhale slowly. You should be able to feel the hand on your abdomen moving in as you exhale.   Learn and use controlled coughing to clear mucus from your lungs. Controlled coughing is a series of short, progressive coughs. The steps of controlled coughing are:  1. Lean your head slightly forward.  2. Breathe in deeply using diaphragmatic breathing.  3. Try to hold your breath for 3 seconds.  4. Keep your mouth slightly open while coughing twice.  5. Spit any mucus out into a tissue.  6. Rest and repeat the steps once or twice as needed. SEEK MEDICAL CARE IF:   You are coughing up more mucus than usual.   There is a change in the color or thickness of your mucus.   Your breathing is more labored than usual.   Your breathing is faster than usual.  SEEK IMMEDIATE MEDICAL CARE IF:   You have shortness of breath while you are resting.   You have shortness of breath that prevents you from:  Being able to talk.   Performing your usual physical activities.   You have chest pain lasting longer than 5 minutes.   Your skin color is more cyanotic than usual.  You measure low oxygen saturations for longer than 5 minutes with a pulse oximeter. MAKE SURE YOU:   Understand these instructions.  Will watch your condition.  Will get help right away if you are not doing well or get worse. Document Released: 01/18/2005 Document Revised: 08/25/2013 Document Reviewed: 12/05/2012 [facility] Patient Information 2015 [place], [place]. This information  is not intended to replace advice given to you by your health care provider. Make sure you discuss any questions you have with your health care provider.

## 2014-01-13 NOTE — Progress Notes (Signed)
Subjective:    Patient ID: Paula Chandler, female    DOB: 1955/08/18, 58 y.o.   MRN: [id number] Chief Complaint  Patient presents with  . Shortness of Breath  . Chest Pain    HPI  Yesterday she developed scratchy throat and cough productive of dark yellow sputum.  She worsened sig while at work o/n and this morning feels horrible. Having a lot SHoB and wheezing.  Having central CP radiating bilaterally under ribs.  CP comes on with deep breathing or coughing.  No f/c. Having rhinitis but no ear pain/sinus pressure. No HA, myalgias/arthralgias. Decreased appetite but no n/v. Has not tried any otc meds.  No known sick contacts. No SHoB at rest but gets easily winded even when she is talking.  No sig pulm hx.  Smoking 8-10 cigs/day.  No ext edema or h/o blood clots.  Currently on prednisone for hip bursitis after cortisone injection - on 4th day of 6d taper - has f/u w/ ortho next week for this.  Past Medical History  Diagnosis Date  . Hypertension    No current outpatient prescriptions on file prior to visit.   No current facility-administered medications on file prior to visit.   No Known Allergies   Review of Systems  Constitutional: Positive for activity change, appetite change and fatigue. Negative for fever, chills and diaphoresis.  HENT: Positive for congestion, postnasal drip, rhinorrhea and sore throat. Negative for ear discharge, ear pain, sinus pressure and trouble swallowing.   Respiratory: Positive for cough, chest tightness, shortness of breath and wheezing.   Cardiovascular: Positive for chest pain. Negative for palpitations and leg swelling.  Gastrointestinal: Negative for nausea and vomiting.  Musculoskeletal: Positive for arthralgias. Negative for back pain, gait problem and myalgias.  Neurological: Negative for headaches.  Hematological: Negative for adenopathy.  Psychiatric/Behavioral: Positive for sleep disturbance.       Objective:  BP 148/94  Pulse  78  Temp(Src) 98 F (36.7 C) (Oral)  Resp 17  Ht  (1.575 m)  Wt 118 lb (53.524 kg)  BMI 21.58 kg/m2  SpO2 98%  Physical Exam  Constitutional: She is oriented to person, place, and time. She appears well-developed and well-nourished. She appears lethargic. She appears ill. No distress.  HENT:  Head: Normocephalic and atraumatic.  Right Ear: External ear and ear canal normal. Tympanic membrane is injected and retracted. A middle ear effusion is present.  Left Ear: External ear and ear canal normal. A middle ear effusion is present.  Nose: Mucosal edema and rhinorrhea present. Right sinus exhibits maxillary sinus tenderness. Left sinus exhibits maxillary sinus tenderness.  Mouth/Throat: Uvula is midline and mucous membranes are normal. Posterior oropharyngeal erythema present. No oropharyngeal exudate, posterior oropharyngeal edema or tonsillar abscesses.  Eyes: Conjunctivae are normal. Right eye exhibits no discharge. Left eye exhibits no discharge. No scleral icterus.  Neck: Normal range of motion. Neck supple.  Cardiovascular: Normal rate, regular rhythm, normal heart sounds and intact distal pulses.   Pulmonary/Chest: Effort normal. She has decreased breath sounds. She has wheezes. She has rhonchi.  Diffuse exp wheezing and rhonchi. Pulmonary exam MUCH improved after duoneb in office with still left lower lobe ext wheezing rhonchi but much less and improved air movement.  Lymphadenopathy:       Head (right side): Submandibular adenopathy present. No preauricular and no posterior auricular adenopathy present.       Head (left side): Submandibular adenopathy present. No preauricular and no posterior auricular adenopathy present.  She has cervical adenopathy.       Right cervical: Superficial cervical adenopathy present. No posterior cervical adenopathy present.      Left cervical: Superficial cervical adenopathy present. No posterior cervical adenopathy present.       Right: No  supraclavicular adenopathy present.       Left: No supraclavicular adenopathy present.  Neurological: She is oriented to person, place, and time. She appears lethargic.  Skin: Skin is warm and dry. She is not diaphoretic. No erythema.  Psychiatric: She has a normal mood and affect. Her behavior is normal.       Results for orders placed in visit on 01/13/14  POCT CBC      Result Value Ref Range   WBC 10.1  4.6 - 10.2 K/uL   Lymph, poc 1.5  0.6 - 3.4   POC LYMPH PERCENT 14.6  10 - 50 %L   MID (cbc) 0.6  0 - 0.9   POC MID % 5.7  0 - 12 %M   POC Granulocyte 8.0 (*) 2 - 6.9   Granulocyte percent 79.7  37 - 80 %G   RBC 3.73 (*) 4.04 - 5.48 M/uL   Hemoglobin 12.6  12.2 - 16.2 g/dL   HCT, POC 40.9  81.1 - 47.9 %   MCV 102.9 (*) 80 - 97 fL   MCH, POC 33.8 (*) 27 - 31.2 pg   MCHC 32.9  31.8 - 35.4 g/dL   RDW, POC 91.4     Platelet Count, POC 242  142 - 424 K/uL   MPV 8.2  0 - 99.8 fL   [facility] reading (PRIMARY) by  Dr. Clelia Croft. 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 Start prednisone 40 qd x 5d then finish ortho pred taper of 20 x 1, the 10 x 1 then stop.  Cover w/ azithro  qd x 3d, tussionex qhs and delsym before work. Use pro-air q4hrs while awake.  Meds ordered this encounter  Medications  . predniSONE (DELTASONE) 10 MG tablet    Sig: Take 10 mg by mouth daily with breakfast.  . albuterol (PROVENTIL) (2.5  MG/3ML) 0.083% nebulizer solution 2.5 mg    Sig:   . chlorpheniramine-HYDROcodone (TUSSIONEX PENNKINETIC ER) 10-8 MG/5ML LQCR    Sig: Take 5 mLs by mouth every 12 (twelve) hours as needed.    Dispense:  120 mL    Refill:  0  . predniSONE (DELTASONE) 20 MG tablet    Sig: Take 2 tablets (40 mg total) by mouth daily with breakfast.    Dispense:  10 tablet    Refill:  0  . azithromycin (ZITHROMAX) 500 MG tablet    Sig: Take 1 tablet (500 mg total) by mouth daily.    Dispense:  3 tablet    Refill:  0  . Albuterol Sulfate (PROAIR RESPICLICK) 108 (90 BASE) MCG/ACT AEPB    Sig: Inhale 2  puffs into the lungs every 4 (four) hours as needed (cough, wheeze, SHoB).    Dispense:  1 each    Refill:  2    Norberto Sorenson, MD MPH

## 2014-01-27 ENCOUNTER — Encounter: Payer: 59 | Admitting: Family Medicine

## 2014-02-04 ENCOUNTER — Ambulatory Visit (INDEPENDENT_AMBULATORY_CARE_PROVIDER_SITE_OTHER): Payer: 59 | Admitting: Family Medicine

## 2014-02-04 VITALS — BP 122/74 | HR 92 | Temp 98.4°F | Resp 16 | Ht 62.0 in | Wt 114.8 lb

## 2014-02-04 DIAGNOSIS — M25552 Pain in left hip: Secondary | ICD-10-CM

## 2014-02-04 DIAGNOSIS — M5432 Sciatica, left side: Secondary | ICD-10-CM

## 2014-02-04 DIAGNOSIS — G8929 Other chronic pain: Secondary | ICD-10-CM

## 2014-02-04 MED ORDER — IBUPROFEN 800 MG PO TABS: ORAL_TABLET | ORAL | Status: DC

## 2014-02-04 MED ORDER — GABAPENTIN 300 MG PO CAPS: ORAL_CAPSULE | ORAL | Status: DC

## 2014-02-04 MED ORDER — ACETAMINOPHEN 500 MG PO CAPS: ORAL_CAPSULE | ORAL | Status: DC

## 2014-02-04 NOTE — Progress Notes (Signed)
IDENTIFYING INFORMATION  Paula Chandler / female / 11-11-1955 / 58 y.o. / MRN: [id number]  The patient has Chronic left hip pain on her problem list.  SUBJECTIVE  Paula Chandler had a chief complaint of Hip Pain.  History of present illness:  Patient reports a long history of hip pain, which started roughly 6 years ago.  She reports the pain as a dull ache, which is constant, and roughly 7-8/10.  It is made worse with physical activity, and better with rest. She also reports having pain that radiates to her foot.  She has been evaluated for this before, and has an MRI scheduled for Saturday. Her work, which is largely physical in nature, exacerbates her pain tremendously.    There is a question of OA of the left hip vs spinal stenosis.  She has been given Meloxicam for this in the recent past, and reports that it helps a little, but she only takes it when she is symptomatic, and is currently not taking anything for pain. She has no history of diabetes, and denies a history of GERD and gastric ulcer.    The patient has a current medication list which includes the following prescription(s): albuterol sulfate and chlorpheniramine-hydrocodone.  Paula Chandler has No Known Allergies. and she  reports that she has been smoking Cigarettes.  She has a 19 pack-year smoking history. She does not have any smokeless tobacco history on file.  The patient  has no past surgical history on file. and her family history includes Cancer in her mother.  Review of Systems  Constitutional: Negative.   HENT: Negative.   Eyes: Negative.   Respiratory: Negative.   Cardiovascular: Negative.   Gastrointestinal: Negative.   Genitourinary: Negative.   Musculoskeletal: Positive for back pain, joint pain and myalgias. Negative for falls.  Skin: Negative.   Neurological: Negative.   Psychiatric/Behavioral: Negative.     OBJECTIVE  Blood pressure 122/74, pulse 92, temperature 98.4 F (36.9 C), temperature source  Oral, resp. rate 16, height 5\' 2"  (1.575 m), weight 114 lb 12.8 oz (52.073 kg), SpO2 97.00%.  Physical Exam  Vitals reviewed. Musculoskeletal:       Right hip: Normal.       Left hip: She exhibits tenderness and bony tenderness. She exhibits normal range of motion, normal strength, no swelling, no crepitus and no deformity.       Lumbar back: She exhibits normal range of motion, no tenderness, no bony tenderness, no swelling, no edema, no deformity, no pain and no spasm.  Neurological: She is alert. She displays no weakness and normal reflexes. She exhibits normal muscle tone. She has an abnormal Straight Leg Raise Test (left). Gait (antalgic) abnormal. She displays no Babinski's sign on the right side. She displays no Babinski's sign on the left side.  Reflex Scores:      Patellar reflexes are 1+ on the right side and 1+ on the left side.      Achilles reflexes are 1+ on the right side and 1+ on the left side.  EXAM:  LEFT HIP - COMPLETE 2+ VIEW  COMPARISON: None.   FINDINGS:  No acute fracture or malalignment identified. No significant  degenerative change. The femoral head is a are intact and symmetric.  No findings suspicious for avascular necrosis. Visualized bowel gas  pattern is unremarkable. There is a metallic radiopacity in the  shape of a safety pin projecting over the L5 vertebral body.  Presumably, this is external to the patient.  Advanced degenerative  disc disease is incompletely imaged. The bones appear well  mineralized without lytic or blastic osseous lesion.   IMPRESSION:  1. No acute fracture, malalignment or osseous abnormality.  2. Metallic foreign body resembling a safety pin projects over the  L5 vertebral body. This is presumed to be external to the patient  and related to the clothing. If no external safety pin is present,  this could represent an ingested foreign body.  3. Advanced multilevel degenerative disc disease incidentally noted  in the lower  lumbar spine.  Electronically Signed  By: Malachy MoanHeath McCullough M.D.  On: 09/19/2013 13:51  ASSESSMENT & PLAN  Chronic left hip pain - Plan: ibuprofen (ADVIL,MOTRIN) 800 MG tablet, Acetaminophen 500 MG capsule.  Patient advised to schedule these medications, and was given a specific schedule.    Sciatica associated with disorder of lumbar spine, left - Plan: gabapentin (NEURONTIN) 300 MG capsule.  Patient advised to take 300 mg at bedtime one day one, 300 mg q 12 hours on day two, and 300 mg q 8 hours on day three.    The patient was instructed to to call or comeback to clinic as needed, or should symptoms warrant.  Deliah BostonMichael Donnette Macmullen, MS, PA-C [facility] 02/04/2014 1:49 PM

## 2014-02-04 NOTE — Patient Instructions (Signed)
Hip Pain Your hip is the joint between your upper legs and your lower pelvis. The bones, cartilage, tendons, and muscles of your hip joint perform a lot of work each day supporting your body weight and allowing you to move around. Hip pain can range from a minor ache to severe pain in one or both of your hips. Pain may be felt on the inside of the hip joint near the groin, or the outside near the buttocks and upper thigh. You may have swelling or stiffness as well.  HOME CARE INSTRUCTIONS   Take medicines only as directed by your health care provider.  Apply ice to the injured area:  Put ice in a plastic bag.  Place a towel between your skin and the bag.  Leave the ice on for 15-20 minutes at a time, 3-4 times a day.  Keep your leg raised (elevated) when possible to lessen swelling.  Avoid activities that cause pain.  Follow specific exercises as directed by your health care provider.  Sleep with a pillow between your legs on your most comfortable side.  Record how often you have hip pain, the location of the pain, and what it feels like. SEEK MEDICAL CARE IF:   You are unable to put weight on your leg.  Your hip is red or swollen or very tender to touch.  Your pain or swelling continues or worsens after 1 week.  You have increasing difficulty walking.  You have a fever. SEEK IMMEDIATE MEDICAL CARE IF:   You have fallen.  You have a sudden increase in pain and swelling in your hip. MAKE SURE YOU:   Understand these instructions.  Will watch your condition.  Will get help right away if you are not doing well or get worse. Document Released: 09/28/2009 Document Revised: 08/25/2013 Document Reviewed: 12/05/2012 [facility] Patient Information 2015 [place], [place]. This information is not intended to replace advice given to you by your health care provider. Make sure you discuss any questions you have with your health care provider. Spinal Stenosis Spinal stenosis is an  abnormal narrowing of the canals of your spine (vertebrae). CAUSES  Spinal stenosis is caused by areas of bone pushing into the central canals of your vertebrae. This condition can be present at birth (congenital). It also may be caused by arthritic deterioration of your vertebrae (spinal degeneration).  SYMPTOMS   Pain that is generally worse with activities, particularly standing and walking.  Numbness, tingling, hot or cold sensations, weakness, or weariness in your legs.  Frequent episodes of falling.  A foot-slapping gait that leads to muscle weakness. DIAGNOSIS  Spinal stenosis is diagnosed with the use of magnetic resonance imaging (MRI) or computed tomography (CT). TREATMENT  Initial therapy for spinal stenosis focuses on the management of the pain and other symptoms associated with the condition. These therapies include:  Practicing postural changes to lessen pressure on your nerves.  Exercises to strengthen the core of your body.  Loss of excess body weight.  The use of nonsteroidal anti-inflammatory medicines to reduce swelling and inflammation in your nerves. When therapies to manage pain are not successful, surgery to treat spinal stenosis may be recommended. This surgery involves removing excess bone, which puts pressure on your nerve roots. During this surgery (laminectomy), the posterior boney arch (lamina) and excess bone around the facet joints are removed. Document Released: 07/01/2003 Document Revised: 08/25/2013 Document Reviewed: 07/19/2012 [facility] Patient Information 2015 [place], [place]. This information is not intended to replace advice given to  you by your health care provider. Make sure you discuss any questions you have with your health care provider.  

## 2018-03-05 ENCOUNTER — Other Ambulatory Visit: Payer: Self-pay

## 2018-03-05 ENCOUNTER — Emergency Department (HOSPITAL_COMMUNITY)
Admission: EM | Admit: 2018-03-05 | Discharge: 2018-03-06 | Disposition: A | Discharge status: Home / Self Care | Payer: Self-pay | Attending: Emergency Medicine | Admitting: Emergency Medicine

## 2018-03-05 ENCOUNTER — Encounter (HOSPITAL_COMMUNITY): Payer: Self-pay | Admitting: Emergency Medicine

## 2018-03-05 DIAGNOSIS — F17210 Nicotine dependence, cigarettes, uncomplicated: Secondary | ICD-10-CM | POA: Insufficient documentation

## 2018-03-05 DIAGNOSIS — I10 Essential (primary) hypertension: Secondary | ICD-10-CM | POA: Insufficient documentation

## 2018-03-05 DIAGNOSIS — M436 Torticollis: Secondary | ICD-10-CM | POA: Insufficient documentation

## 2018-03-05 MED ORDER — KETOROLAC TROMETHAMINE 30 MG/ML IJ SOLN
15.0000 mg | Freq: Once | INTRAMUSCULAR | Status: AC
  Administered 2018-03-06: 15 mg via INTRAVENOUS
  Filled 2018-03-05: qty 1

## 2018-03-05 MED ORDER — HYDROMORPHONE HCL 1 MG/ML IJ SOLN
1.0000 mg | Freq: Once | INTRAMUSCULAR | Status: AC
  Administered 2018-03-06: 1 mg via INTRAVENOUS
  Filled 2018-03-05: qty 1

## 2018-03-05 NOTE — ED Triage Notes (Addendum)
Patient c/o pain to right posterior head and neck since yesterday, reports pain has progressed to bilateral neck pain. Denies injury. Reports two episodes of emesis. Reports pain worsens with movement. Hx migraines.

## 2018-03-06 MED ORDER — MELOXICAM 15 MG PO TABS: 15.0000 mg | ORAL_TABLET | Freq: Every day | ORAL | 0 refills | Status: DC

## 2018-03-06 MED ORDER — DIAZEPAM 5 MG PO TABS: 5.0000 mg | ORAL_TABLET | Freq: Four times a day (QID) | ORAL | 0 refills | Status: AC | PRN

## 2018-03-06 NOTE — ED Provider Notes (Signed)
[facility]-EMERGENCY DEPT Provider Note   CSN: [id number] Arrival date & time: 03/05/18  1654     History   Chief Complaint Chief Complaint  Patient presents with  . Neck Pain    HPI Paula Chandler is a 62 y.o. female.  Patient presents to the emergency department for evaluation of neck pain.  Patient reports that pain started on the right side of her neck 1 day ago but has become severe and now was on both sides of her neck.  Patient reports severe pain with trying to turn her head, bend her neck.  Patient denies any injury.  She does not have any pain in the upper extremities, no numbness, tingling or weakness of extremities.     Past Medical History:  Diagnosis Date  . Hypertension     Patient Active Problem List   Diagnosis Date Noted  . Chronic left hip pain 02/04/2014    History reviewed. No pertinent surgical history.   OB History   None      Home Medications    Prior to Admission medications   Medication Sig Start Date End Date Taking? Authorizing Provider  acetaminophen (TYLENOL) 500 MG tablet Take 1,000 mg by mouth every 6 (six) hours as needed for mild pain.   Yes [provider]  Aspirin-Salicylamide-Caffeine (BC FAST PAIN RELIEF) 650-195-33.3 MG PACK Take 1 Package by mouth daily as needed (pain).   Yes [provider]  naproxen sodium (ALEVE) 220 MG tablet Take 440 mg by mouth daily as needed (pain).   Yes [provider]  diazepam (VALIUM) 5 MG tablet Take 1 tablet (5 mg total) by mouth every 6 (six) hours as needed for muscle spasms. 03/06/18   Gilda Crease, MD  meloxicam (MOBIC) 15 MG tablet Take 1 tablet (15 mg total) by mouth daily. 03/06/18   Gilda Crease, MD    Family History Family History  Problem Relation Age of Onset  . Cancer Mother     Social History Social History   Tobacco Use  . Smoking status: Current Every Day Smoker    Packs/day: 0.50    Years: 38.00    Pack years: 19.00    Types: Cigarettes  Substance Use Topics  . Alcohol use: Yes  . Drug use: Not on file     Allergies   Patient has no known allergies.   Review of Systems Review of Systems  Musculoskeletal: Positive for neck pain.  All other systems reviewed and are negative.    Physical Exam Updated Vital Signs BP (!) 157/103 (BP Location: Left Arm)   Pulse 88   Temp 99.1 F (37.3 C) (Oral)   Resp 18   Ht 5\' 4"  (1.626 m)   Wt 57.4 kg   SpO2 100%   BMI 21.72 kg/m   Physical Exam  Constitutional: She is oriented to person, place, and time. She appears well-developed and well-nourished. No distress.  HENT:  Head: Normocephalic and atraumatic.  Right Ear: Hearing normal.  Left Ear: Hearing normal.  Nose: Nose normal.  Mouth/Throat: Oropharynx is clear and moist and mucous membranes are normal.  Eyes: Pupils are equal, round, and reactive to light. Conjunctivae and EOM are normal.  Neck: Normal range of motion. Neck supple. Muscular tenderness (Severe spasm) present.    Cardiovascular: Regular rhythm, S1 normal and S2 normal. Exam reveals no gallop and no friction rub.  No murmur heard. Pulmonary/Chest: Effort normal and breath sounds normal. No respiratory distress. She  exhibits no tenderness.  Abdominal: Soft. Normal appearance and bowel sounds are normal. There is no hepatosplenomegaly. There is no tenderness. There is no rebound, no guarding, no tenderness at McBurney's point and negative Murphy's sign. No hernia.  Musculoskeletal: Normal range of motion.  Neurological: She is alert and oriented to person, place, and time. She has normal strength. No cranial nerve deficit or sensory deficit. Coordination normal. GCS eye subscore is 4. GCS verbal subscore is 5. GCS motor subscore is 6.  Normal flexion extension at elbows and wrists, normal abduction, abduction, opposition of fingers, normal sensation throughout  Skin: Skin is warm, dry and intact. No rash noted.  No cyanosis.  Psychiatric: She has a normal mood and affect. Her speech is normal and behavior is normal. Thought content normal.  Nursing note and vitals reviewed.    ED Treatments / Results  Labs (all labs ordered are listed, but only abnormal results are displayed) Labs Reviewed - No data to display  EKG None  Radiology No results found.  Procedures Procedures (including critical care time)  Medications Ordered in ED Medications  HYDROmorphone (DILAUDID) injection 1 mg (1 mg Intravenous Given 03/06/18 0033)  ketorolac (TORADOL) 30 MG/ML injection 15 mg (15 mg Intravenous Given 03/06/18 0032)     Initial Impression / Assessment and Plan / ED Course  I have reviewed the triage vital signs and the nursing notes.  Pertinent labs & imaging results that were available during my care of the patient were reviewed by me and considered in my medical decision making (see chart for details).     Patient presents with nontraumatic pain of her neck bilaterally.  Pain started on the right but now is bilateral.  Examination reveals severe spasm of the paraspinal muscles bilaterally.  There has not been any injury.  Patient has no radicular component, neurologic examination is normal.  This is consistent with torticollis, patient had significant improvement with Toradol and Dilaudid.  Will discharge with Mobic and Valium  Final Clinical Impressions(s) / ED Diagnoses   Final diagnoses:  Torticollis, acute    ED Discharge Orders         Ordered    meloxicam (MOBIC) 15 MG tablet  Daily     03/06/18 0112    diazepam (VALIUM) 5 MG tablet  Every 6 hours PRN     03/06/18 0112           Gilda CreasePollina, Christopher J, MD 03/06/18 [phone]

## 2018-11-07 ENCOUNTER — Encounter (HOSPITAL_COMMUNITY): Payer: Self-pay | Admitting: Emergency Medicine

## 2018-11-07 ENCOUNTER — Emergency Department (HOSPITAL_COMMUNITY)
Admission: EM | Admit: 2018-11-07 | Discharge: 2018-11-07 | Disposition: A | Discharge status: Home / Self Care | Payer: Self-pay | Attending: Emergency Medicine | Admitting: Emergency Medicine

## 2018-11-07 ENCOUNTER — Other Ambulatory Visit: Payer: Self-pay

## 2018-11-07 DIAGNOSIS — I10 Essential (primary) hypertension: Secondary | ICD-10-CM | POA: Insufficient documentation

## 2018-11-07 DIAGNOSIS — Z79899 Other long term (current) drug therapy: Secondary | ICD-10-CM | POA: Insufficient documentation

## 2018-11-07 DIAGNOSIS — R109 Unspecified abdominal pain: Secondary | ICD-10-CM | POA: Insufficient documentation

## 2018-11-07 DIAGNOSIS — F17210 Nicotine dependence, cigarettes, uncomplicated: Secondary | ICD-10-CM | POA: Insufficient documentation

## 2018-11-07 DIAGNOSIS — M109 Gout, unspecified: Secondary | ICD-10-CM

## 2018-11-07 HISTORY — DX: Gout, unspecified: M10.9

## 2018-11-07 MED ORDER — PREDNISONE 20 MG PO TABS
60.0000 mg | ORAL_TABLET | Freq: Once | ORAL | Status: AC
  Administered 2018-11-07: 60 mg via ORAL
  Filled 2018-11-07: qty 3

## 2018-11-07 MED ORDER — PREDNISONE 20 MG PO TABS: 60.0000 mg | ORAL_TABLET | Freq: Every day | ORAL | 0 refills | Status: AC

## 2018-11-07 MED ORDER — HYDROCODONE-ACETAMINOPHEN 5-325 MG PO TABS
1.0000 | ORAL_TABLET | Freq: Once | ORAL | Status: AC
  Administered 2018-11-07: 1 via ORAL
  Filled 2018-11-07: qty 1

## 2018-11-07 MED ORDER — HYDROCODONE-ACETAMINOPHEN 5-325 MG PO TABS: 2.0000 | ORAL_TABLET | ORAL | 0 refills | Status: DC | PRN

## 2018-11-07 NOTE — ED Provider Notes (Signed)
[facility]-EMERGENCY DEPT Provider Note   CSN: [id number] Arrival date & time: 11/07/18  1141    History   Chief Complaint Chief Complaint  Patient presents with  . Ankle Pain    HPI Paula Chandler is a 63 y.o. female.     Paula Chandler is a 63 y.o. female with a history of hypertension and gout, who presents to the ED for evaluation of redness pain and swelling in her right big toe, top of the foot and ankle.  She reports symptoms started about 3 days ago at that point she only had redness and pain in the big toe but over the past 2 days she has noticed the area that has popped up on top of her foot as well as over the medial aspect of her right ankle.  She reports the joints are painful to move, and weightbearing is difficult.  She reports this feels similar to gout that she had in the past, although that improved at home with ibuprofen and cherry juice, she has been doing this at home but pain is continued to worsen.  She denies any associated fevers, no red or swollen joints elsewhere.  No numbness tingling or weakness.  No injury to the foot.  No other aggravating or alleviating factors.     Past Medical History:  Diagnosis Date  . Gout   . Hypertension     Patient Active Problem List   Diagnosis Date Noted  . Chronic left hip pain 02/04/2014    History reviewed. No pertinent surgical history.   OB History   No obstetric history on file.      Home Medications    Prior to Admission medications   Medication Sig Start Date End Date Taking? Authorizing Provider  acetaminophen (TYLENOL) 500 MG tablet Take 1,000 mg by mouth every 6 (six) hours as needed for mild pain.    [provider]  Aspirin-Salicylamide-Caffeine (BC FAST PAIN RELIEF) 650-195-33.3 MG PACK Take 1 Package by mouth daily as needed (pain).    [provider]  diazepam (VALIUM) 5 MG tablet Take 1 tablet (5 mg total) by mouth every 6 (six) hours as needed for muscle  spasms. 03/06/18   Gilda CreasePollina, Christopher J, MD  HYDROcodone-acetaminophen (NORCO) 5-325 MG tablet Take 2 tablets by mouth every 4 (four) hours as needed. 11/07/18   Dartha LodgeFord, Kelsey N, PA-C  meloxicam (MOBIC) 15 MG tablet Take 1 tablet (15 mg total) by mouth daily. 03/06/18   Gilda CreasePollina, Christopher J, MD  naproxen sodium (ALEVE) 220 MG tablet Take 440 mg by mouth daily as needed (pain).    [provider]  predniSONE (DELTASONE) 20 MG tablet Take 3 tablets (60 mg total) by mouth daily for 5 days. 11/07/18 11/12/18  Dartha LodgeFord, Kelsey N, PA-C    Family History Family History  Problem Relation Age of Onset  . Cancer Mother     Social History Social History   Tobacco Use  . Smoking status: Current Every Day Smoker    Packs/day: 0.50    Years: 38.00    Pack years: 19.00    Types: Cigarettes  Substance Use Topics  . Alcohol use: Yes  . Drug use: Not on file     Allergies   Patient has no known allergies.   Review of Systems Review of Systems  Constitutional: Negative for chills and fever.  Musculoskeletal: Positive for arthralgias and joint swelling.  Skin: Positive for color change. Negative for wound.  Neurological: Negative for  weakness and numbness.     Physical Exam Updated Vital Signs BP 128/76 (BP Location: Right Arm)   Pulse 97   Temp 98.3 F (36.8 C) (Oral)   Resp 16   SpO2 100%   Physical Exam Vitals signs and nursing note reviewed.  Constitutional:      General: She is not in acute distress.    Appearance: Normal appearance. She is well-developed and normal weight. She is not diaphoretic.  HENT:     Head: Normocephalic and atraumatic.  Eyes:     General:        Right eye: No discharge.        Left eye: No discharge.  Pulmonary:     Effort: Pulmonary effort is normal. No respiratory distress.  Musculoskeletal:     Comments: Right foot with redness and swelling localized over the first MTP joint, as well as over the cuboid navicular joint and medial right  ankle.  These areas are also warm to the touch and tender.  No redness spreading up the leg.  Able to wiggle toes with normal sensation and strength.  2+ DP and TP pulses with good cap refill.  (See photo below).  Skin:    General: Skin is warm and dry.     Capillary Refill: Capillary refill takes less than 2 seconds.  Neurological:     Mental Status: She is alert and oriented to person, place, and time.     Coordination: Coordination normal.  Psychiatric:        Mood and Affect: Mood normal.        Behavior: Behavior normal.          ED Treatments / Results  Labs (all labs ordered are listed, but only abnormal results are displayed) Labs Reviewed - No data to display  EKG None  Radiology No results found.  Procedures Procedures (including critical care time)  Medications Ordered in ED Medications  predniSONE (DELTASONE) tablet 60 mg (60 mg Oral Given 11/07/18 1338)  HYDROcodone-acetaminophen (NORCO/VICODIN) 5-325 MG per tablet 1 tablet (1 tablet Oral Given 11/07/18 1339)     Initial Impression / Assessment and Plan / ED Course  I have reviewed the triage vital signs and the nursing notes.  Pertinent labs & imaging results that were available during my care of the patient were reviewed by me and considered in my medical decision making (see chart for details).  Patient presents with redness swelling warmth and pain in the right foot, symptoms seem consistent with her prior gout episodes.  Not improving with usual home ibuprofen and cherry juice.  No associated injury.  Exam does not suggest cellulitis and given that there are 3 locations this appears to be more of a spreading inflammatory process rather than multiple septic joints.  Case discussed with Dr. Laverta Baltimore, will treat with prednisone to help reduce inflammation, Norco for pain.  Return precautions discussed with the patient she expresses understanding and agreement with plan.  Discharged home in good condition.  Final  Clinical Impressions(s) / ED Diagnoses   Final diagnoses:  Acute gout of right foot, unspecified cause    ED Discharge Orders         Ordered    predniSONE (DELTASONE) 20 MG tablet  Daily     11/07/18 1325    HYDROcodone-acetaminophen (NORCO) 5-325 MG tablet  Every 4 hours PRN     11/07/18 1326           Jacqlyn Larsen, [place] 11/07/18 1534  Maia PlanLong, Joshua G, MD 11/07/18 1925

## 2018-11-07 NOTE — ED Triage Notes (Signed)
Per pt, states right ankle pain-states she has had gout in the past-states right ankle swollen and painful to bear weight-has not been on meds for gout-treated it in past with cherry juice

## 2018-11-07 NOTE — Discharge Instructions (Signed)
Take steroids and pain medication as directed, you should start to see improvement in the nest 2-3 days. If you note spreading redness, fevers, significantly worsened pain or any other new or concerning symptoms please return to ED for re-evaluation. If symptoms are not resolving you may need to follow up with PCP for further evaluation. Please use phone number on paper work today to establish care with PCP.

## 2021-05-09 ENCOUNTER — Emergency Department (HOSPITAL_COMMUNITY): Payer: Medicare Other

## 2021-05-09 ENCOUNTER — Emergency Department (HOSPITAL_COMMUNITY)
Admission: EM | Admit: 2021-05-09 | Discharge: 2021-05-10 | Disposition: A | Discharge status: Home / Self Care | Payer: Medicare Other | Attending: Emergency Medicine | Admitting: Emergency Medicine

## 2021-05-09 ENCOUNTER — Encounter (HOSPITAL_COMMUNITY): Payer: Self-pay

## 2021-05-09 ENCOUNTER — Other Ambulatory Visit: Payer: Self-pay

## 2021-05-09 DIAGNOSIS — M79605 Pain in left leg: Secondary | ICD-10-CM | POA: Insufficient documentation

## 2021-05-09 DIAGNOSIS — R269 Unspecified abnormalities of gait and mobility: Secondary | ICD-10-CM | POA: Insufficient documentation

## 2021-05-09 DIAGNOSIS — Z7982 Long term (current) use of aspirin: Secondary | ICD-10-CM | POA: Insufficient documentation

## 2021-05-09 DIAGNOSIS — M47816 Spondylosis without myelopathy or radiculopathy, lumbar region: Secondary | ICD-10-CM | POA: Diagnosis not present

## 2021-05-09 DIAGNOSIS — M25552 Pain in left hip: Secondary | ICD-10-CM | POA: Insufficient documentation

## 2021-05-09 DIAGNOSIS — W101XXA Fall (on)(from) sidewalk curb, initial encounter: Secondary | ICD-10-CM | POA: Diagnosis not present

## 2021-05-09 MED ORDER — OXYCODONE-ACETAMINOPHEN 5-325 MG PO TABS
1.0000 | ORAL_TABLET | Freq: Once | ORAL | Status: AC
  Administered 2021-05-09: 1 via ORAL
  Filled 2021-05-09: qty 1

## 2021-05-09 NOTE — ED Triage Notes (Signed)
Pt complains of left hip pain since falling on Saturday.

## 2021-05-09 NOTE — ED Provider Triage Note (Signed)
Emergency Medicine Provider Triage Evaluation Note  Paula Chandler , a 66 y.o. female  was evaluated in triage.  Pt complains of gradual onset, constant, worsening, left hip pain status post fall that occurred 2 days ago.  Patient states that she tripped over a curb and landed on her left hip.  She states that the pain has gradually worsened since that time.  She has been taking BC powder without relief.  She denies any head injury or loss of consciousness.  No other complaints at this time..  Review of Systems  Positive: + L hip pain Negative: - weakness, numbness, tingling, head injury  Physical Exam  BP (!) 177/103 (BP Location: Left Arm)    Pulse 90    Temp 97.9 F (36.6 C) (Oral)    Resp 16    Ht 5\' 4"  (1.626 m)    Wt 59 kg    SpO2 98%    BMI 22.31 kg/m  Gen:   Awake, no distress   Resp:  Normal effort  MSK:   Moves extremities without difficulty  Other:  NO ecchymosis noted to hip. No obvious external rotation or leg shortening. + TTP to left lateral hip. ROM limited s/2 pain. 2+ PT pulse.   Medical Decision Making  Medically screening exam initiated at 8:38 PM.  Appropriate orders placed.  Dotsie Dorff was informed that the remainder of the evaluation will be completed by another provider, this initial triage assessment does not replace that evaluation, and the importance of remaining in the ED until their evaluation is complete.     Eustaquio Maize, PA-C 05/09/21 2040

## 2021-05-10 MED ORDER — OXYCODONE-ACETAMINOPHEN 5-325 MG PO TABS
1.0000 | ORAL_TABLET | Freq: Once | ORAL | Status: AC
  Administered 2021-05-10: 1 via ORAL
  Filled 2021-05-10: qty 1

## 2021-05-10 MED ORDER — OXYCODONE-ACETAMINOPHEN 5-325 MG PO TABS: 1.0000 | ORAL_TABLET | Freq: Four times a day (QID) | ORAL | 0 refills | Status: DC | PRN

## 2021-05-10 MED ORDER — LIDOCAINE 5 % EX PTCH
1.0000 | MEDICATED_PATCH | CUTANEOUS | Status: DC
  Administered 2021-05-10: 1 via TRANSDERMAL
  Filled 2021-05-10: qty 1

## 2021-05-10 NOTE — Discharge Instructions (Addendum)
You are seen in the ER today for your hip pain.  Your x-ray was negative but this is not the most sensitive test.  A CT scan was recommended but you opted to forego further imaging tonight and and return if necessary.  Your insurance does not cover lidocaine patches but you may pick them up over-the-counter if they are of benefit to you.    Return to the ER for develop any new numbness, tingling, weakness in your leg, Your pain does not prove or you develop any other new severe symptom.

## 2021-05-10 NOTE — ED Provider Notes (Signed)
[facility]-EMERGENCY DEPT Provider Note   CSN: [id number] Arrival date & time: 05/09/21  1931     History  Chief Complaint  Patient presents with   Hip Pain    Paula Chandler is a 66 y.o. female who presents with concern for left hip pain since falling 2 days ago.  Patient states that she stepped off of a curb that was much higher than she anticipated firmly onto her left leg with significant pain and fell onto her left hip.  Since that time she has had a significantly difficult time walking, limping favoring the left leg quite heavily.  Denies any numbness, tingling and weakness in the leg.  Has not been seen by medical provider since this time.  Has been taking BC powder at home without improvement.  I have personally reviewed this patient's medical records.  She has history of chronic left hip pain, hypertension, and gout.  She is not on any medications daily.  She is not anticoagulated.  HPI     Home Medications Prior to Admission medications   Medication Sig Start Date End Date Taking? Authorizing Provider  oxyCODONE-acetaminophen (PERCOCET/ROXICET) 5-325 MG tablet Take 1 tablet by mouth every 6 (six) hours as needed for severe pain. 05/10/21  Yes Deetta Siegmann, Eugene Gaviaebekah R, PA-C  Aspirin-Salicylamide-Caffeine (BC FAST PAIN RELIEF) 650-195-33.3 MG PACK Take 1 Package by mouth daily as needed (pain).    [provider]  diazepam (VALIUM) 5 MG tablet Take 1 tablet (5 mg total) by mouth every 6 (six) hours as needed for muscle spasms. 03/06/18   Gilda CreasePollina, Christopher J, MD  meloxicam (MOBIC) 15 MG tablet Take 1 tablet (15 mg total) by mouth daily. 03/06/18   Gilda CreasePollina, Christopher J, MD  naproxen sodium (ALEVE) 220 MG tablet Take 440 mg by mouth daily as needed (pain).    [provider]      Allergies    Patient has no known allergies.    Review of Systems   Review of Systems  Constitutional: Negative.   HENT: Negative.    Eyes: Negative.    Respiratory: Negative.    Cardiovascular: Negative.   Gastrointestinal: Negative.   Genitourinary: Negative.   Musculoskeletal:  Positive for myalgias.  Neurological: Negative.   Hematological: Negative.   Psychiatric/Behavioral: Negative.     Physical Exam Updated Vital Signs BP (!) 115/95    Pulse 94    Temp 97.9 F (36.6 C) (Oral)    Resp 14    Ht 5\' 4"  (1.626 m)    Wt 59 kg    SpO2 99%    BMI 22.31 kg/m  Physical Exam Vitals and nursing note reviewed.  HENT:     Head: Normocephalic and atraumatic.     Mouth/Throat:     Mouth: Mucous membranes are moist.     Pharynx: No oropharyngeal exudate or posterior oropharyngeal erythema.  Eyes:     General:        Right eye: No discharge.        Left eye: No discharge.     Extraocular Movements: Extraocular movements intact.     Conjunctiva/sclera: Conjunctivae normal.     Pupils: Pupils are equal, round, and reactive to light.  Cardiovascular:     Rate and Rhythm: Normal rate and regular rhythm.     Pulses: Normal pulses.          Dorsalis pedis pulses are 2+ on the right side and 2+ on the left side.  Pulmonary:  Effort: Pulmonary effort is normal. No respiratory distress.     Breath sounds: Normal breath sounds. No wheezing or rales.  Abdominal:     General: Bowel sounds are normal. There is no distension.     Palpations: Abdomen is soft.     Tenderness: There is no abdominal tenderness. There is no guarding or rebound.  Musculoskeletal:        General: No deformity.     Cervical back: Neck supple.     Right hip: Normal.     Left hip: Tenderness and bony tenderness present. No crepitus. Normal range of motion.     Right upper leg: Normal.     Left upper leg: Tenderness and bony tenderness present.     Right knee: Normal.     Left knee: Normal.     Right lower leg: Normal.     Left lower leg: Normal.     Right ankle: Normal.     Right Achilles Tendon: Normal.     Left ankle: Normal.     Left Achilles Tendon:  Normal.     Right foot: Normal.     Left foot: Normal.       Legs:  Skin:    General: Skin is warm and dry.     Capillary Refill: Capillary refill takes less than 2 seconds.  Neurological:     General: No focal deficit present.     Mental Status: She is alert and oriented to person, place, and time. Mental status is at baseline.     Sensory: Sensation is intact.     Gait: Gait abnormal.     Comments: Patient with antalgic gait favoring the left leg significantly.  Psychiatric:        Mood and Affect: Mood normal.    ED Results / Procedures / Treatments   Labs (all labs ordered are listed, but only abnormal results are displayed) Labs Reviewed - No data to display  EKG None  Radiology DG Hip Unilat With Pelvis 2-3 Views Left  Result Date: 05/09/2021 CLINICAL DATA:  hip pain s/p fall EXAM: DG HIP (WITH OR WITHOUT PELVIS) 2-3V LEFT COMPARISON:  None. FINDINGS: There is no evidence of hip fracture or dislocation of the left hip. No acute displaced fracture or dislocation of the right hip on frontal view. No acute displaced fracture or diastasis of the bones of the pelvis. Degenerative changes of the lumbar spine. Sacrum grossly unremarkable. There is no evidence of severe arthropathy or other focal bone abnormality. IMPRESSION: Negative for acute traumatic injury. Electronically Signed   By: Tish Frederickson M.D.   On: 05/09/2021 21:00    Procedures Procedures    Medications Ordered in ED Medications  lidocaine (LIDODERM) 5 % 1 patch (1 patch Transdermal Patch Applied 05/10/21 0148)  oxyCODONE-acetaminophen (PERCOCET/ROXICET) 5-325 MG per tablet 1 tablet (1 tablet Oral Given 05/09/21 2109)  oxyCODONE-acetaminophen (PERCOCET/ROXICET) 5-325 MG per tablet 1 tablet (1 tablet Oral Given 05/10/21 0147)    ED Course/ Medical Decision Making/ A&P                           Medical Decision Making Risk Prescription drug management.   66 year old female presents with left hip pain  after fall.   Hypertensive on intake, vital signs otherwise normal.  Cardiopulmonary exam is normal, abdominal exam is benign.  Musculoskeletal exam revealed tenderness palpation over the left greater trochanter and femur without, crepitus, or erythema.  No warmth  to the touch.  Differential diagnosis includes but is limited to acute fracture /dislocation, contusion, cellulitis, bursitis.  Plain film was obtained from triage and was negative for any acute traumatic injury of the left hip or pelvis.  Given mechanism of injury and degree of pain that patient is experiencing question possible underlying occult fracture.  CT scan offered but patient adamantly refused stating that she would rather go home at this time.  She does not have a PCP to follow-up with but states she will return if her pain worsens or fails to improve.  Lidoderm patch and oral analgesia offered in the emergency department.  It was communicated again to the patient that we cannot confirm she does not have hip fracture without further imaging, however as patient is ambulatory independently and has normal vital signs we will proceed with charge at this time.  Preslie voiced understanding of her medical evaluation and treatment plan.  Each of her questions was answered to her expressed satisfaction.  Strict return precautions were given.  Patient was stable upon discharge.   This chart was dictated using voice recognition software, Dragon. Despite the best efforts of this provider to proofread and correct errors, errors may still occur which can change documentation meaning.  Final Clinical Impression(s) / ED Diagnoses Final diagnoses:  Left hip pain    Rx / DC Orders ED Discharge Orders          Ordered    oxyCODONE-acetaminophen (PERCOCET/ROXICET) 5-325 MG tablet  Every 6 hours PRN        05/10/21 0157              Nylia Gavina, Eugene Gavia, PA-C 05/10/21 0703    Nira Conn, MD 05/10/21 [phone]

## 2021-12-16 ENCOUNTER — Other Ambulatory Visit: Payer: Self-pay | Admitting: Family Medicine

## 2021-12-16 DIAGNOSIS — Z720 Tobacco use: Secondary | ICD-10-CM

## 2021-12-19 ENCOUNTER — Other Ambulatory Visit: Payer: Self-pay | Admitting: Family Medicine

## 2021-12-19 DIAGNOSIS — Z0000 Encounter for general adult medical examination without abnormal findings: Secondary | ICD-10-CM

## 2021-12-19 DIAGNOSIS — Z1231 Encounter for screening mammogram for malignant neoplasm of breast: Secondary | ICD-10-CM

## 2022-01-11 ENCOUNTER — Ambulatory Visit
Admission: RE | Admit: 2022-01-11 | Discharge: 2022-01-11 | Disposition: A | Discharge status: Home / Self Care | Payer: Medicare Other | Source: Ambulatory Visit | Attending: Family Medicine | Admitting: Family Medicine

## 2022-01-11 DIAGNOSIS — Z1231 Encounter for screening mammogram for malignant neoplasm of breast: Secondary | ICD-10-CM

## 2022-01-11 DIAGNOSIS — Z720 Tobacco use: Secondary | ICD-10-CM

## 2023-04-02 IMAGING — CR DG HIP (WITH OR WITHOUT PELVIS) 2-3V*L*
3 series · 3 of 3 positions shown · non-contrast
Comparison: None.

CLINICAL DATA: hip pain s/p fall

EXAM:
DG HIP (WITH OR WITHOUT PELVIS) 2-3V LEFT

[t pelvis ap]
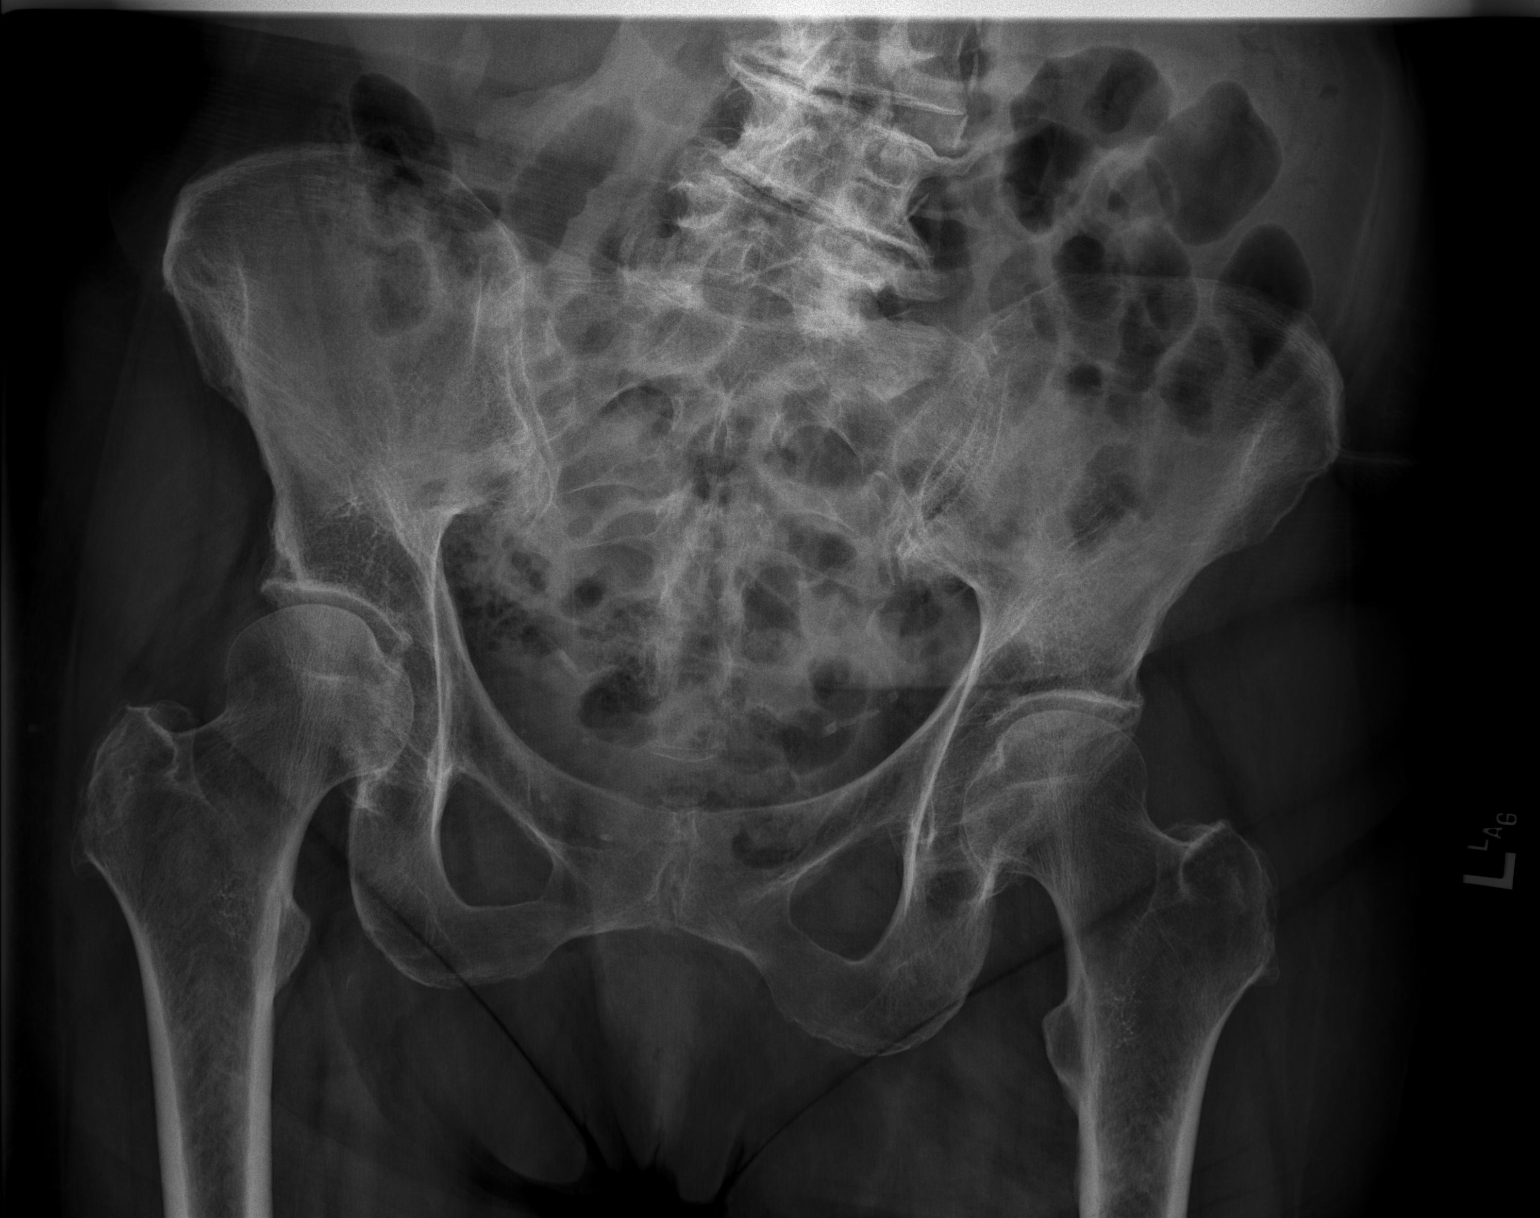

[t hip ap left]
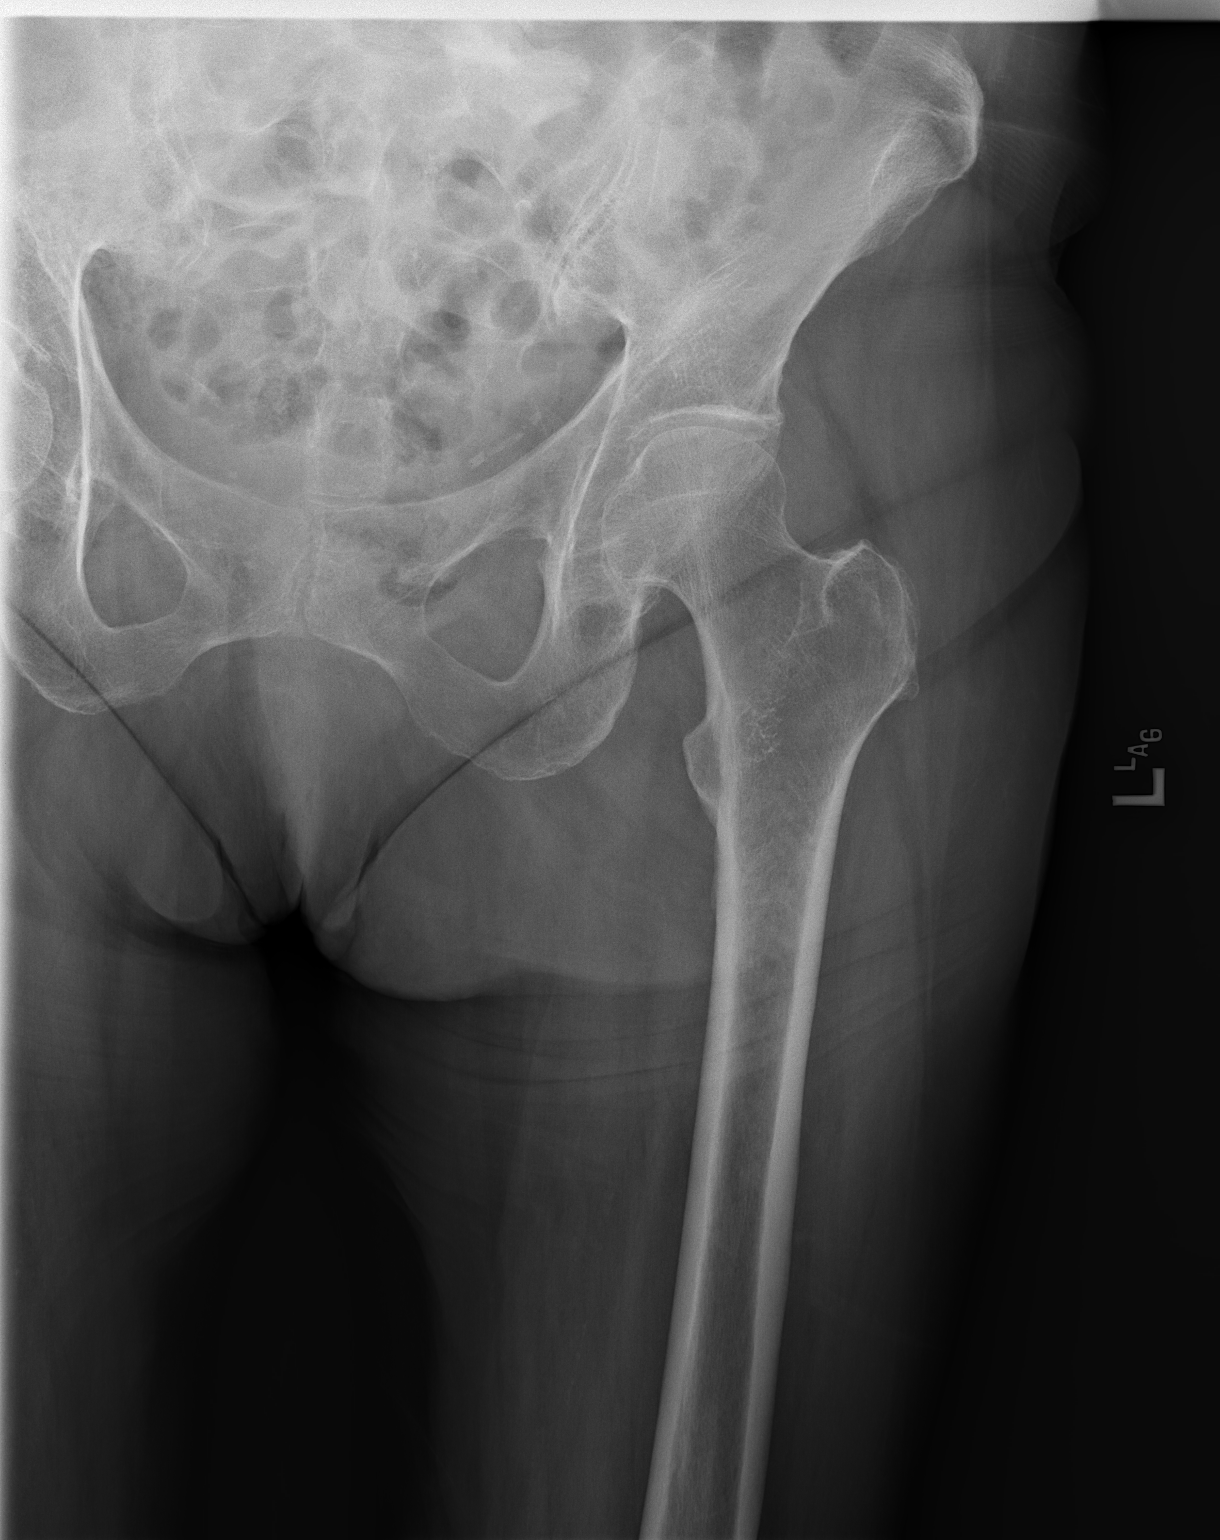

[t hip frog leg left]
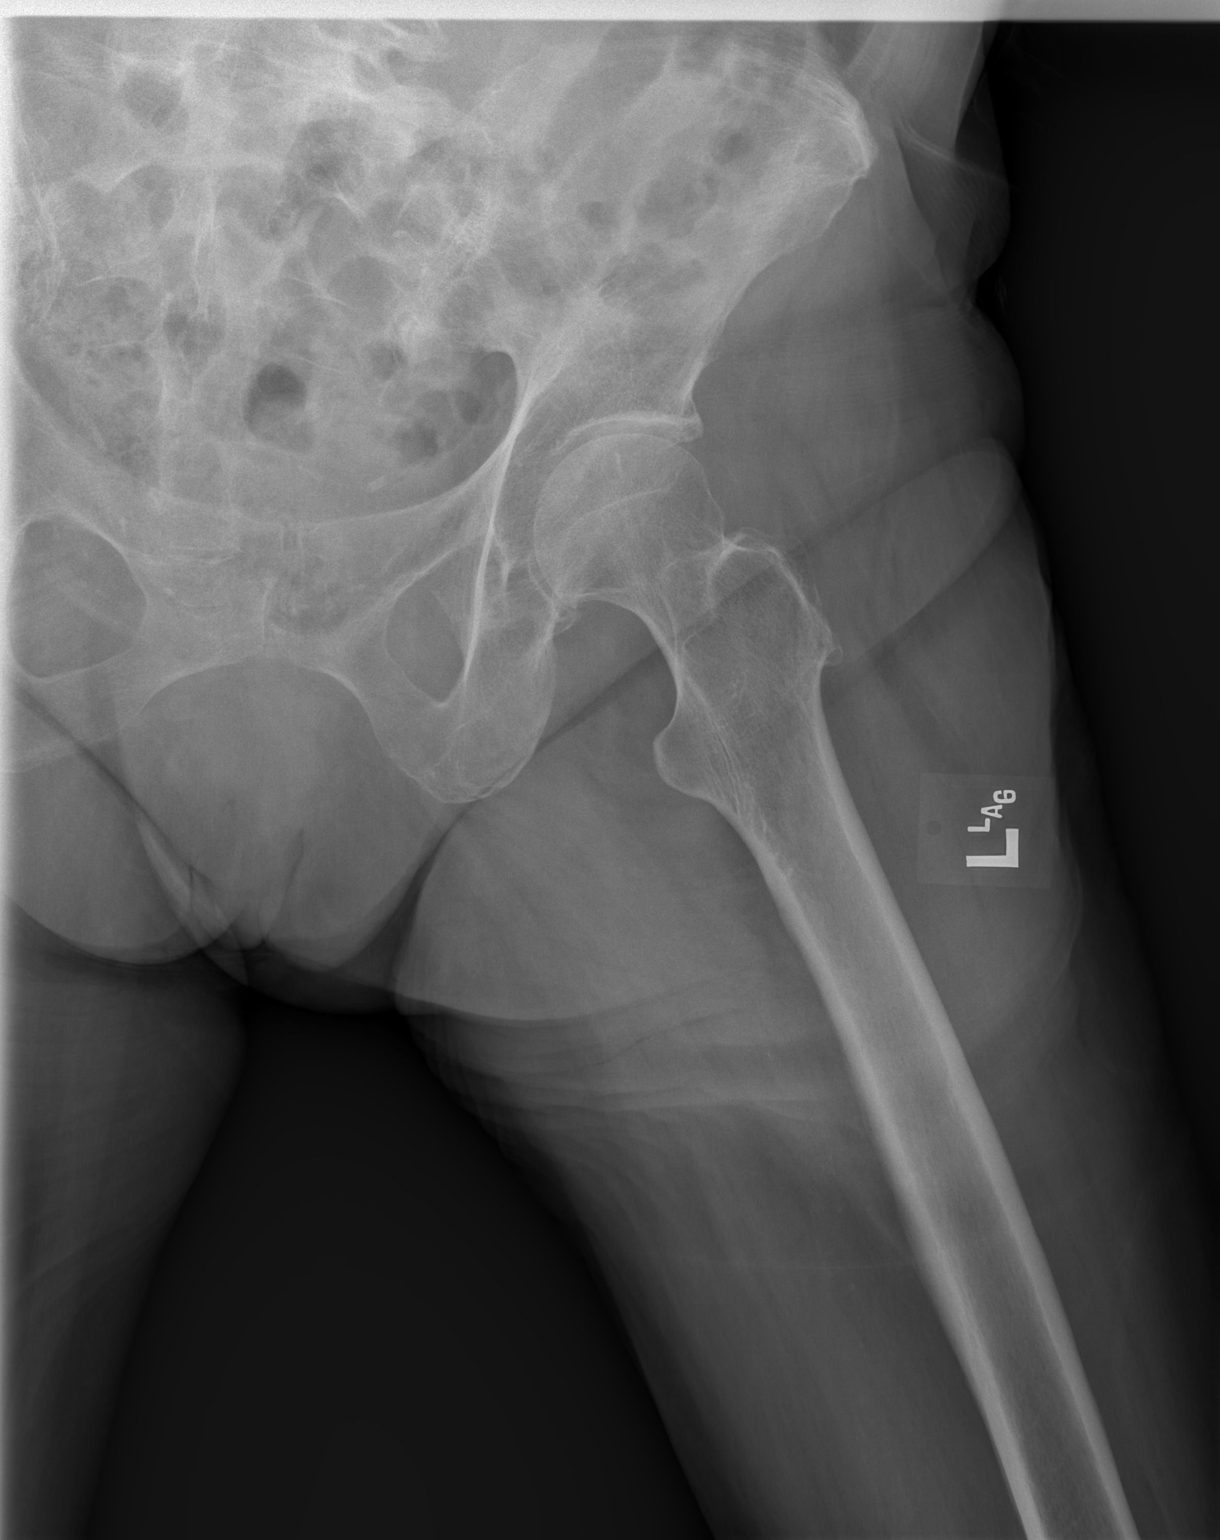

[3 of 3 positions shown; findings below may reference images not displayed]

FINDINGS: There is no evidence of hip fracture or dislocation of the left hip.
No acute displaced fracture or dislocation of the right hip on
frontal view. No acute displaced fracture or diastasis of the bones
of the pelvis. Degenerative changes of the lumbar spine. Sacrum
grossly unremarkable. There is no evidence of severe arthropathy or
other focal bone abnormality.
IMPRESSION: Negative for acute traumatic injury.

## 2023-08-05 DIAGNOSIS — R072 Precordial pain: Secondary | ICD-10-CM | POA: Diagnosis not present

## 2023-08-05 DIAGNOSIS — I10 Essential (primary) hypertension: Secondary | ICD-10-CM | POA: Diagnosis not present

## 2023-08-05 DIAGNOSIS — Z8501 Personal history of malignant neoplasm of esophagus: Secondary | ICD-10-CM | POA: Diagnosis not present

## 2023-08-05 DIAGNOSIS — M79604 Pain in right leg: Secondary | ICD-10-CM | POA: Insufficient documentation

## 2023-08-05 DIAGNOSIS — R0789 Other chest pain: Secondary | ICD-10-CM | POA: Diagnosis present

## 2023-08-05 DIAGNOSIS — K5900 Constipation, unspecified: Secondary | ICD-10-CM | POA: Diagnosis not present

## 2023-08-06 ENCOUNTER — Emergency Department (HOSPITAL_COMMUNITY)
Admission: EM | Admit: 2023-08-06 | Discharge: 2023-08-06 | Disposition: A | Discharge status: Home / Self Care | Attending: Emergency Medicine | Admitting: Emergency Medicine

## 2023-08-06 ENCOUNTER — Emergency Department (HOSPITAL_COMMUNITY)

## 2023-08-06 ENCOUNTER — Other Ambulatory Visit: Payer: Self-pay

## 2023-08-06 DIAGNOSIS — K5900 Constipation, unspecified: Secondary | ICD-10-CM

## 2023-08-06 DIAGNOSIS — R072 Precordial pain: Secondary | ICD-10-CM

## 2023-08-06 DIAGNOSIS — M79604 Pain in right leg: Secondary | ICD-10-CM

## 2023-08-06 LAB — HEPATIC FUNCTION PANEL
ALT: 8 U/L (ref 0–44)
AST: 20 U/L (ref 15–41)
Albumin: 3 g/dL — ABNORMAL LOW (ref 3.5–5.0)
Alkaline Phosphatase: 73 U/L (ref 38–126)
Bilirubin, Direct: <0.1 mg/dL (ref 0.0–0.2)
Total Bilirubin: 0.7 mg/dL (ref 0.0–1.2)
Total Protein: 6.1 g/dL — ABNORMAL LOW (ref 6.5–8.1)

## 2023-08-06 LAB — TROPONIN I (HIGH SENSITIVITY)
Troponin I (High Sensitivity): 6 ng/L (ref <18)
Troponin I (High Sensitivity): 7 ng/L (ref <18)

## 2023-08-06 LAB — CBC
HCT: 32.7 % — ABNORMAL LOW (ref 36.0–46.0)
Hemoglobin: 10.9 g/dL — ABNORMAL LOW (ref 12.0–15.0)
MCH: 33.4 pg (ref 26.0–34.0)
MCHC: 33.3 g/dL (ref 30.0–36.0)
MCV: 100.3 fL — ABNORMAL HIGH (ref 80.0–100.0)
Platelets: 222 10*3/uL (ref 150–400)
RBC: 3.26 MIL/uL — ABNORMAL LOW (ref 3.87–5.11)
RDW: 13.9 % (ref 11.5–15.5)
WBC: 4.5 10*3/uL (ref 4.0–10.5)
nRBC: 0 % (ref 0.0–0.2)

## 2023-08-06 LAB — BASIC METABOLIC PANEL WITH GFR
Anion gap: 10 (ref 5–15)
BUN: 9 mg/dL (ref 8–23)
CO2: 27 mmol/L (ref 22–32)
Calcium: 9.1 mg/dL (ref 8.9–10.3)
Chloride: 97 mmol/L — ABNORMAL LOW (ref 98–111)
Creatinine, Ser: 0.31 mg/dL — ABNORMAL LOW (ref 0.44–1.00)
GFR, Estimated: >60 mL/min (ref >60)
Glucose, Bld: 88 mg/dL (ref 70–99)
Potassium: 3.3 mmol/L — ABNORMAL LOW (ref 3.5–5.1)
Sodium: 134 mmol/L — ABNORMAL LOW (ref 135–145)

## 2023-08-06 LAB — PROTIME-INR
INR: 1 (ref 0.8–1.2)
Prothrombin Time: 13.2 s (ref 11.4–15.2)

## 2023-08-06 LAB — LIPASE, BLOOD: Lipase: 20 U/L (ref 11–51)

## 2023-08-06 MED ORDER — IOHEXOL 350 MG/ML SOLN
75.0000 mL | Freq: Once | INTRAVENOUS | Status: AC | PRN
Start: 2023-08-06 — End: 2023-08-06
  Administered 2023-08-06: 75 mL via INTRAVENOUS

## 2023-08-06 MED ORDER — SENNOSIDES-DOCUSATE SODIUM 8.6-50 MG PO TABS: 1.0000 | ORAL_TABLET | Freq: Every evening | ORAL | 0 refills | Status: AC | PRN

## 2023-08-06 NOTE — ED Provider Notes (Signed)
 Emergency Department Provider Note   I have reviewed the triage vital signs and the nursing notes.   HISTORY  Chief Complaint Leg Pain, Constipation, and Chest Pain   HPI Paula Chandler is a 68 y.o. female with past history of hypertension and squamous cell cancer of the esophagus presents to the emergency department with pain in the right lower extremity.  She has a prior history of DVT and was on Xarelto until December when she could no longer afford it.  She is been taking a baby aspirin daily.  She has developed some swelling in the proximal right thigh similar to her prior DVT symptoms.  This has been developing over the past 1 to 2 weeks.  No injury.  This evening, she developed sudden chest tightness in the left chest.  No shortness of breath.  No fevers, chills, cough.   Past Medical History:  Diagnosis Date   Gout    Hypertension     Review of Systems  Constitutional: No fever/chills Cardiovascular: Positive chest pain. Respiratory: Denies shortness of breath. Gastrointestinal: No abdominal pain. Genitourinary: Negative for dysuria. Musculoskeletal: Negative for back pain. Positive right leg pain.  Skin: Negative for rash. Neurological: Negative for headache.  ____________________________________________   PHYSICAL EXAM:  VITAL SIGNS: ED Triage Vitals [08/06/23 0001]  Encounter Vitals Group     BP 126/80     Pulse Rate 69     Resp 18     Temp 98 F (36.7 C)     Temp src      SpO2 99 %   Constitutional: Alert and oriented. Well appearing and in no acute distress. Eyes: Conjunctivae are normal.  Head: Atraumatic. Nose: No congestion/rhinnorhea. Mouth/Throat: Mucous membranes are moist.  Neck: No stridor.   Cardiovascular: Normal rate, regular rhythm. Good peripheral circulation. 2+ DP pulses. Grossly normal heart sounds.   Respiratory: Normal respiratory effort.  No retractions. Lungs CTAB. Gastrointestinal: No distention.  Musculoskeletal: No  visible leg swelling, bruising, erythema. Neurologic:  Normal speech and language.  Skin:  Skin is warm, dry and intact. No rash noted.  ____________________________________________   LABS (all labs ordered are listed, but only abnormal results are displayed)  Labs Reviewed  BASIC METABOLIC PANEL WITH GFR - Abnormal; Notable for the following components:      Result Value   Sodium 134 (*)    Potassium 3.3 (*)    Chloride 97 (*)    Creatinine, Ser 0.31 (*)    All other components within normal limits  CBC - Abnormal; Notable for the following components:   RBC 3.26 (*)    Hemoglobin 10.9 (*)    HCT 32.7 (*)    MCV 100.3 (*)    All other components within normal limits  HEPATIC FUNCTION PANEL - Abnormal; Notable for the following components:   Total Protein 6.1 (*)    Albumin 3.0 (*)    All other components within normal limits  LIPASE, BLOOD  PROTIME-INR  TROPONIN I (HIGH SENSITIVITY)  TROPONIN I (HIGH SENSITIVITY)   ____________________________________________  EKG   EKG Interpretation Date/Time:  Sunday August 05 2023 23:58:58 EDT Ventricular Rate:  70 PR Interval:  124 QRS Duration:  95 QT Interval:  248 QTC Calculation: 268 R Axis:   82  Text Interpretation: Sinus rhythm Borderline right axis deviation Abnormal T, consider ischemia, lateral leads No old tracing to compare Confirmed by Alona Bene [phone]) on 08/06/2023 12:25:10 AM        ____________________________________________  RADIOLOGY  CT Angio Chest PE W and/or Wo Contrast Result Date: 08/06/2023 CLINICAL DATA:  Pulmonary embolism suspected. EXAM: CT ANGIOGRAPHY CHEST WITH CONTRAST TECHNIQUE: Multidetector CT imaging of the chest was performed using the standard protocol during bolus administration of intravenous contrast. Multiplanar CT image reconstructions and MIPs were obtained to evaluate the vascular anatomy. RADIATION DOSE REDUCTION: This exam was performed according to the departmental  dose-optimization program which includes automated exposure control, adjustment of the mA and/or kV according to patient size and/or use of iterative reconstruction technique. CONTRAST:  75mL OMNIPAQUE IOHEXOL 350 MG/ML SOLN COMPARISON:  Chest CT 01/11/2022 FINDINGS: Cardiovascular: Satisfactory opacification of the pulmonary arteries to the segmental level. No evidence of pulmonary embolism. Normal heart size. No pericardial effusion. Atheromatous calcification of the aorta and coronaries. Mediastinum/Nodes: Thickened upper esophagus similar to prior and not well assessed on this study with arterial timing. No detected change from PET CT 06/11/2023 Lungs/Pleura: Generalized airway thickening. There is dependent atelectasis and small layering pleural effusions. There is no edema, consolidation, or pneumothorax. Upper Abdomen: Located percutaneous gastrostomy tube. Adenopathy in the upper abdomen underestimated relative to prior PET-CT. Musculoskeletal: Generalized thoracic spine degeneration. No acute or aggressive finding Review of the MIP images confirms the above findings. IMPRESSION: Negative for pulmonary embolism. Small pleural effusions with atelectasis. History of metastatic esophageal cancer without detected change from 06/11/2023 PET-CT. Electronically Signed   By: Tiburcio Pea M.D.   On: 08/06/2023 05:25   DG Chest 2 View Result Date: 08/06/2023 CLINICAL DATA:  Chest pain EXAM: CHEST - 2 VIEW COMPARISON:  04/08/2022 FINDINGS: There is hyperinflation of the lungs compatible with COPD. Heart and mediastinal contours are within normal limits. No focal opacities or effusions. No acute bony abnormality. IMPRESSION: COPD.  No active disease. Electronically Signed   By: Charlett Nose M.D.   On: 08/06/2023 01:26    ____________________________________________   PROCEDURES  Procedure(s) performed:   Procedures  None  ____________________________________________   INITIAL IMPRESSION /  ASSESSMENT AND PLAN / ED COURSE  Pertinent labs & imaging results that were available during my care of the patient were reviewed by me and considered in my medical decision making (see chart for details).   This patient is Presenting for Evaluation of CP, which does require a range of treatment options, and is a complaint that involves a high risk of morbidity and mortality.  The Differential Diagnoses includes but is not exclusive to acute coronary syndrome, aortic dissection, pulmonary embolism, cardiac tamponade, community-acquired pneumonia, pericarditis, musculoskeletal chest wall pain, etc.   Critical Interventions-    Medications  iohexol (OMNIPAQUE) 350 MG/ML injection 75 mL (75 mLs Intravenous Contrast Given 08/06/23 0514)    Reassessment after intervention:  no worsening symptoms.   I decided to review pertinent External Data, and in summary patient followed in the [facility] system with esophageal cancer.    Clinical Laboratory Tests Ordered, included Troponin negative. LFTs and bilirubin negative. CBC without leukocytosis.   Radiologic Tests Ordered, included CXR. CTA PE. I independently interpreted the images and agree with radiology interpretation.   Cardiac Monitor Tracing which shows NSR.    Social Determinants of Health Risk patient is not an active smoker.   Medical Decision Making: Summary:  Patient presents emergency department for evaluation of right leg pain and sudden onset chest discomfort.  She is actively being treated for cancer with last chemotherapy 2 months prior.  She is not anticoagulated with a prior history of DVT.  She is high risk for PE.  Plan for CTA PE scan along with labs and reassess.   Reevaluation with update and discussion with patient. No PE. DVT US  not available at this hour. Offered to arrange this at [facility] later today. Patient declines. She has an oncology appointment later today and will discuss DVT US  with them to have this done in [facility].   Considered admission but ED workup is reassuring.   Patient's presentation is most consistent with acute presentation with potential threat to life or bodily function.   Disposition: discharge  ____________________________________________  FINAL CLINICAL IMPRESSION(S) / ED DIAGNOSES  Final diagnoses:  Precordial chest pain  Right leg pain  Constipation, unspecified constipation type     NEW OUTPATIENT MEDICATIONS STARTED DURING THIS VISIT:  New Prescriptions   SENNA-DOCUSATE (SENOKOT-S) 8.6-50 MG TABLET    Take 1 tablet by mouth at bedtime as needed for mild constipation or moderate constipation.    Note:  This document was prepared using Dragon voice recognition software and may include unintentional dictation errors.  Abby Hocking, MD, [facility] Emergency Medicine    Zenobia Kuennen, Shereen Dike, MD 08/06/23 [phone]

## 2023-08-06 NOTE — ED Triage Notes (Signed)
 Pt c/o pain inner right leg, chest pain and constipation. States it feels like a mass on medial right leg. Had a blood clot in 2023 and says it feels the same today. Pt reports "tightness at top left breast, almost feels like indigestion but it's like a pulling feeling."

## 2023-08-06 NOTE — Discharge Instructions (Signed)
 Please discuss your ED visit with your PCP and Oncology doctors. Return with any new or worsening symptoms.

## 2023-09-23 DEATH — deceased
